# Patient Record
Sex: Female | Born: 1940 | Race: White | Hispanic: No | Marital: Single | Smoking: Never smoker
Health system: Southern US, Community
[De-identification: ages and names within clinical notes are randomized; demographics above are authoritative.]

## PROBLEM LIST (undated history)

## (undated) DIAGNOSIS — M109 Gout, unspecified: Secondary | ICD-10-CM

## (undated) DIAGNOSIS — I10 Essential (primary) hypertension: Secondary | ICD-10-CM

## (undated) DIAGNOSIS — N289 Disorder of kidney and ureter, unspecified: Secondary | ICD-10-CM

## (undated) DIAGNOSIS — E039 Hypothyroidism, unspecified: Secondary | ICD-10-CM

## (undated) DIAGNOSIS — E785 Hyperlipidemia, unspecified: Secondary | ICD-10-CM

## (undated) DIAGNOSIS — I6529 Occlusion and stenosis of unspecified carotid artery: Secondary | ICD-10-CM

## (undated) DIAGNOSIS — R7301 Impaired fasting glucose: Secondary | ICD-10-CM

## (undated) DIAGNOSIS — K219 Gastro-esophageal reflux disease without esophagitis: Secondary | ICD-10-CM

## (undated) DIAGNOSIS — J45909 Unspecified asthma, uncomplicated: Secondary | ICD-10-CM

---

## 2015-02-25 DIAGNOSIS — N1831 Chronic kidney disease, stage 3a: Secondary | ICD-10-CM | POA: Diagnosis present

## 2015-02-25 DIAGNOSIS — K219 Gastro-esophageal reflux disease without esophagitis: Secondary | ICD-10-CM | POA: Insufficient documentation

## 2015-02-25 DIAGNOSIS — H8109 Meniere's disease, unspecified ear: Secondary | ICD-10-CM | POA: Insufficient documentation

## 2015-02-25 DIAGNOSIS — J4520 Mild intermittent asthma, uncomplicated: Secondary | ICD-10-CM | POA: Insufficient documentation

## 2015-02-25 DIAGNOSIS — E039 Hypothyroidism, unspecified: Secondary | ICD-10-CM | POA: Insufficient documentation

## 2015-02-25 DIAGNOSIS — M109 Gout, unspecified: Secondary | ICD-10-CM | POA: Insufficient documentation

## 2015-02-25 DIAGNOSIS — E785 Hyperlipidemia, unspecified: Secondary | ICD-10-CM | POA: Insufficient documentation

## 2015-05-27 DIAGNOSIS — E559 Vitamin D deficiency, unspecified: Secondary | ICD-10-CM | POA: Insufficient documentation

## 2016-09-17 DIAGNOSIS — M7741 Metatarsalgia, right foot: Secondary | ICD-10-CM | POA: Insufficient documentation

## 2020-10-20 DIAGNOSIS — I771 Stricture of artery: Secondary | ICD-10-CM | POA: Insufficient documentation

## 2021-07-02 ENCOUNTER — Emergency Department (HOSPITAL_COMMUNITY): Payer: Medicare Other

## 2021-07-02 ENCOUNTER — Inpatient Hospital Stay (HOSPITAL_COMMUNITY)
Admission: EM | Admit: 2021-07-02 | Discharge: 2021-07-08 | DRG: 640 | Disposition: A | Discharge status: Skilled Nursing Facility | Payer: Medicare Other | Attending: Internal Medicine | Admitting: Internal Medicine

## 2021-07-02 ENCOUNTER — Observation Stay (HOSPITAL_COMMUNITY): Payer: Medicare Other

## 2021-07-02 ENCOUNTER — Other Ambulatory Visit: Payer: Self-pay

## 2021-07-02 ENCOUNTER — Encounter (HOSPITAL_COMMUNITY): Payer: Self-pay | Admitting: Emergency Medicine

## 2021-07-02 DIAGNOSIS — K219 Gastro-esophageal reflux disease without esophagitis: Secondary | ICD-10-CM | POA: Diagnosis present

## 2021-07-02 DIAGNOSIS — Z880 Allergy status to penicillin: Secondary | ICD-10-CM

## 2021-07-02 DIAGNOSIS — N1831 Chronic kidney disease, stage 3a: Secondary | ICD-10-CM | POA: Diagnosis present

## 2021-07-02 DIAGNOSIS — E876 Hypokalemia: Secondary | ICD-10-CM | POA: Diagnosis present

## 2021-07-02 DIAGNOSIS — R9431 Abnormal electrocardiogram [ECG] [EKG]: Secondary | ICD-10-CM | POA: Diagnosis present

## 2021-07-02 DIAGNOSIS — T424X5A Adverse effect of benzodiazepines, initial encounter: Secondary | ICD-10-CM | POA: Diagnosis present

## 2021-07-02 DIAGNOSIS — H8109 Meniere's disease, unspecified ear: Secondary | ICD-10-CM | POA: Diagnosis present

## 2021-07-02 DIAGNOSIS — R54 Age-related physical debility: Secondary | ICD-10-CM | POA: Diagnosis present

## 2021-07-02 DIAGNOSIS — J479 Bronchiectasis, uncomplicated: Secondary | ICD-10-CM | POA: Diagnosis present

## 2021-07-02 DIAGNOSIS — G9340 Encephalopathy, unspecified: Secondary | ICD-10-CM | POA: Diagnosis not present

## 2021-07-02 DIAGNOSIS — R001 Bradycardia, unspecified: Secondary | ICD-10-CM | POA: Diagnosis not present

## 2021-07-02 DIAGNOSIS — Z881 Allergy status to other antibiotic agents status: Secondary | ICD-10-CM

## 2021-07-02 DIAGNOSIS — I6523 Occlusion and stenosis of bilateral carotid arteries: Secondary | ICD-10-CM | POA: Diagnosis present

## 2021-07-02 DIAGNOSIS — N179 Acute kidney failure, unspecified: Secondary | ICD-10-CM | POA: Diagnosis present

## 2021-07-02 DIAGNOSIS — I129 Hypertensive chronic kidney disease with stage 1 through stage 4 chronic kidney disease, or unspecified chronic kidney disease: Secondary | ICD-10-CM | POA: Diagnosis present

## 2021-07-02 DIAGNOSIS — E871 Hypo-osmolality and hyponatremia: Secondary | ICD-10-CM | POA: Diagnosis present

## 2021-07-02 DIAGNOSIS — E039 Hypothyroidism, unspecified: Secondary | ICD-10-CM | POA: Diagnosis present

## 2021-07-02 DIAGNOSIS — R569 Unspecified convulsions: Secondary | ICD-10-CM | POA: Diagnosis present

## 2021-07-02 DIAGNOSIS — I10 Essential (primary) hypertension: Secondary | ICD-10-CM | POA: Diagnosis present

## 2021-07-02 DIAGNOSIS — T450X5A Adverse effect of antiallergic and antiemetic drugs, initial encounter: Secondary | ICD-10-CM | POA: Diagnosis present

## 2021-07-02 DIAGNOSIS — Z888 Allergy status to other drugs, medicaments and biological substances status: Secondary | ICD-10-CM

## 2021-07-02 DIAGNOSIS — R4701 Aphasia: Secondary | ICD-10-CM | POA: Diagnosis present

## 2021-07-02 DIAGNOSIS — G928 Other toxic encephalopathy: Secondary | ICD-10-CM | POA: Diagnosis present

## 2021-07-02 DIAGNOSIS — H3530 Unspecified macular degeneration: Secondary | ICD-10-CM | POA: Diagnosis present

## 2021-07-02 DIAGNOSIS — E441 Mild protein-calorie malnutrition: Secondary | ICD-10-CM | POA: Diagnosis present

## 2021-07-02 DIAGNOSIS — R4182 Altered mental status, unspecified: Principal | ICD-10-CM

## 2021-07-02 DIAGNOSIS — D631 Anemia in chronic kidney disease: Secondary | ICD-10-CM | POA: Diagnosis present

## 2021-07-02 DIAGNOSIS — J45909 Unspecified asthma, uncomplicated: Secondary | ICD-10-CM | POA: Diagnosis present

## 2021-07-02 DIAGNOSIS — Z7982 Long term (current) use of aspirin: Secondary | ICD-10-CM

## 2021-07-02 DIAGNOSIS — E785 Hyperlipidemia, unspecified: Secondary | ICD-10-CM | POA: Diagnosis present

## 2021-07-02 DIAGNOSIS — Z8701 Personal history of pneumonia (recurrent): Secondary | ICD-10-CM

## 2021-07-02 DIAGNOSIS — M109 Gout, unspecified: Secondary | ICD-10-CM | POA: Diagnosis present

## 2021-07-02 DIAGNOSIS — F05 Delirium due to known physiological condition: Secondary | ICD-10-CM | POA: Diagnosis not present

## 2021-07-02 DIAGNOSIS — Z1152 Encounter for screening for COVID-19: Secondary | ICD-10-CM

## 2021-07-02 DIAGNOSIS — Z79899 Other long term (current) drug therapy: Secondary | ICD-10-CM

## 2021-07-02 DIAGNOSIS — Z6821 Body mass index (BMI) 21.0-21.9, adult: Secondary | ICD-10-CM

## 2021-07-02 HISTORY — DX: Unspecified asthma, uncomplicated: J45.909

## 2021-07-02 HISTORY — DX: Disorder of kidney and ureter, unspecified: N28.9

## 2021-07-02 HISTORY — DX: Gout, unspecified: M10.9

## 2021-07-02 HISTORY — DX: Hyperlipidemia, unspecified: E78.5

## 2021-07-02 HISTORY — DX: Gastro-esophageal reflux disease without esophagitis: K21.9

## 2021-07-02 HISTORY — DX: Hypothyroidism, unspecified: E03.9

## 2021-07-02 HISTORY — DX: Occlusion and stenosis of unspecified carotid artery: I65.29

## 2021-07-02 HISTORY — DX: Essential (primary) hypertension: I10

## 2021-07-02 HISTORY — DX: Impaired fasting glucose: R73.01

## 2021-07-02 LAB — DIFFERENTIAL
Abs Immature Granulocytes: 0.02 10*3/uL (ref 0.00–0.07)
Basophils Absolute: 0 10*3/uL (ref 0.0–0.1)
Basophils Relative: 0 %
Eosinophils Absolute: 0 10*3/uL (ref 0.0–0.5)
Eosinophils Relative: 0 %
Immature Granulocytes: 0 %
Lymphocytes Relative: 25 %
Lymphs Abs: 1.9 10*3/uL (ref 0.7–4.0)
Monocytes Absolute: 0.4 10*3/uL (ref 0.1–1.0)
Monocytes Relative: 5 %
Neutro Abs: 5.4 10*3/uL (ref 1.7–7.7)
Neutrophils Relative %: 70 %

## 2021-07-02 LAB — CBC
HCT: 31.2 % — ABNORMAL LOW (ref 36.0–46.0)
Hemoglobin: 10.5 g/dL — ABNORMAL LOW (ref 12.0–15.0)
MCH: 29.1 pg (ref 26.0–34.0)
MCHC: 33.7 g/dL (ref 30.0–36.0)
MCV: 86.4 fL (ref 80.0–100.0)
Platelets: 166 10*3/uL (ref 150–400)
RBC: 3.61 MIL/uL — ABNORMAL LOW (ref 3.87–5.11)
RDW: 14.2 % (ref 11.5–15.5)
WBC: 7.7 10*3/uL (ref 4.0–10.5)
nRBC: 0 % (ref 0.0–0.2)

## 2021-07-02 LAB — URINALYSIS, ROUTINE W REFLEX MICROSCOPIC
Bilirubin Urine: NEGATIVE
Glucose, UA: NEGATIVE mg/dL
Hgb urine dipstick: NEGATIVE
Ketones, ur: NEGATIVE mg/dL
Leukocytes,Ua: NEGATIVE
Nitrite: NEGATIVE
Protein, ur: 30 mg/dL — AB
Specific Gravity, Urine: 1.01 (ref 1.005–1.030)
pH: 7.5 (ref 5.0–8.0)

## 2021-07-02 LAB — COMPREHENSIVE METABOLIC PANEL
ALT: 13 U/L (ref 0–44)
AST: 15 U/L (ref 15–41)
Albumin: 3.8 g/dL (ref 3.5–5.0)
Alkaline Phosphatase: 82 U/L (ref 38–126)
Anion gap: 15 (ref 5–15)
BUN: 11 mg/dL (ref 8–23)
CO2: 29 mmol/L (ref 22–32)
Calcium: 7.6 mg/dL — ABNORMAL LOW (ref 8.9–10.3)
Chloride: 92 mmol/L — ABNORMAL LOW (ref 98–111)
Creatinine, Ser: 0.86 mg/dL (ref 0.44–1.00)
GFR, Estimated: >60 mL/min (ref >60)
Glucose, Bld: 121 mg/dL — ABNORMAL HIGH (ref 70–99)
Potassium: 2.6 mmol/L — CL (ref 3.5–5.1)
Sodium: 136 mmol/L (ref 135–145)
Total Bilirubin: 1.2 mg/dL (ref 0.3–1.2)
Total Protein: 6.7 g/dL (ref 6.5–8.1)

## 2021-07-02 LAB — BASIC METABOLIC PANEL
Anion gap: 13 (ref 5–15)
BUN: 10 mg/dL (ref 8–23)
CO2: 27 mmol/L (ref 22–32)
Calcium: 7.6 mg/dL — ABNORMAL LOW (ref 8.9–10.3)
Chloride: 94 mmol/L — ABNORMAL LOW (ref 98–111)
Creatinine, Ser: 0.86 mg/dL (ref 0.44–1.00)
GFR, Estimated: >60 mL/min (ref >60)
Glucose, Bld: 101 mg/dL — ABNORMAL HIGH (ref 70–99)
Potassium: 4.6 mmol/L (ref 3.5–5.1)
Sodium: 134 mmol/L — ABNORMAL LOW (ref 135–145)

## 2021-07-02 LAB — URINALYSIS, MICROSCOPIC (REFLEX): Bacteria, UA: NONE SEEN

## 2021-07-02 LAB — RAPID URINE DRUG SCREEN, HOSP PERFORMED
Amphetamines: NOT DETECTED
Barbiturates: NOT DETECTED
Benzodiazepines: POSITIVE — AB
Cocaine: NOT DETECTED
Opiates: NOT DETECTED
Tetrahydrocannabinol: NOT DETECTED

## 2021-07-02 LAB — I-STAT CHEM 8, ED
BUN: 11 mg/dL (ref 8–23)
Calcium, Ion: 0.8 mmol/L — CL (ref 1.15–1.40)
Chloride: 87 mmol/L — ABNORMAL LOW (ref 98–111)
Creatinine, Ser: 0.8 mg/dL (ref 0.44–1.00)
Glucose, Bld: 120 mg/dL — ABNORMAL HIGH (ref 70–99)
HCT: 31 % — ABNORMAL LOW (ref 36.0–46.0)
Hemoglobin: 10.5 g/dL — ABNORMAL LOW (ref 12.0–15.0)
Potassium: 2.5 mmol/L — CL (ref 3.5–5.1)
Sodium: 134 mmol/L — ABNORMAL LOW (ref 135–145)
TCO2: 32 mmol/L (ref 22–32)

## 2021-07-02 LAB — RESP PANEL BY RT-PCR (FLU A&B, COVID) ARPGX2
Influenza A by PCR: NEGATIVE
Influenza B by PCR: NEGATIVE
SARS Coronavirus 2 by RT PCR: NEGATIVE

## 2021-07-02 LAB — HEMOGLOBIN A1C
Hgb A1c MFr Bld: 4.5 % — ABNORMAL LOW (ref 4.8–5.6)
Mean Plasma Glucose: 82.45 mg/dL

## 2021-07-02 LAB — CBG MONITORING, ED: Glucose-Capillary: 127 mg/dL — ABNORMAL HIGH (ref 70–99)

## 2021-07-02 LAB — LACTIC ACID, PLASMA: Lactic Acid, Venous: 1.3 mmol/L (ref 0.5–1.9)

## 2021-07-02 LAB — APTT: aPTT: 29 seconds (ref 24–36)

## 2021-07-02 LAB — PROTIME-INR
INR: 1.2 (ref 0.8–1.2)
Prothrombin Time: 14.8 seconds (ref 11.4–15.2)

## 2021-07-02 LAB — MAGNESIUM: Magnesium: 0.8 mg/dL — CL (ref 1.7–2.4)

## 2021-07-02 LAB — ETHANOL: Alcohol, Ethyl (B): <10 mg/dL (ref <10)

## 2021-07-02 MED ORDER — LEVETIRACETAM 500 MG PO TABS
500.0000 mg | ORAL_TABLET | Freq: Two times a day (BID) | ORAL | Status: DC
  Administered 2021-07-03 – 2021-07-08 (×10): 500 mg via ORAL
  Filled 2021-07-02 (×13): qty 1

## 2021-07-02 MED ORDER — POLYETHYL GLYCOL-PROPYL GLYCOL 0.4-0.3 % OP SOLN
1.0000 [drp] | Freq: Every day | OPHTHALMIC | Status: DC
Start: 2021-07-02 — End: 2021-07-02

## 2021-07-02 MED ORDER — LOSARTAN POTASSIUM 50 MG PO TABS
100.0000 mg | ORAL_TABLET | Freq: Every day | ORAL | Status: DC
  Administered 2021-07-02 – 2021-07-08 (×7): 100 mg via ORAL
  Filled 2021-07-02 (×7): qty 2

## 2021-07-02 MED ORDER — HYDRALAZINE HCL 20 MG/ML IJ SOLN
10.0000 mg | Freq: Once | INTRAMUSCULAR | Status: AC
  Administered 2021-07-02: 10 mg via INTRAVENOUS
  Filled 2021-07-02: qty 1

## 2021-07-02 MED ORDER — IPRATROPIUM-ALBUTEROL 20-100 MCG/ACT IN AERS: 1.0000 | INHALATION_SPRAY | Freq: Four times a day (QID) | RESPIRATORY_TRACT | Status: DC | PRN

## 2021-07-02 MED ORDER — MONTELUKAST SODIUM 10 MG PO TABS
10.0000 mg | ORAL_TABLET | Freq: Every day | ORAL | Status: DC
  Administered 2021-07-03 – 2021-07-08 (×6): 10 mg via ORAL
  Filled 2021-07-02 (×6): qty 1

## 2021-07-02 MED ORDER — ASPIRIN 81 MG PO TBEC
81.0000 mg | DELAYED_RELEASE_TABLET | Freq: Every day | ORAL | Status: DC
  Administered 2021-07-03 – 2021-07-08 (×6): 81 mg via ORAL
  Filled 2021-07-02 (×6): qty 1

## 2021-07-02 MED ORDER — CALCIUM GLUCONATE-NACL 1-0.675 GM/50ML-% IV SOLN
1.0000 g | Freq: Once | INTRAVENOUS | Status: AC
  Administered 2021-07-02: 1000 mg via INTRAVENOUS
  Filled 2021-07-02: qty 50

## 2021-07-02 MED ORDER — PANTOPRAZOLE SODIUM 40 MG PO TBEC
40.0000 mg | DELAYED_RELEASE_TABLET | Freq: Every day | ORAL | Status: DC
  Administered 2021-07-03 – 2021-07-08 (×6): 40 mg via ORAL
  Filled 2021-07-02 (×6): qty 1

## 2021-07-02 MED ORDER — ACETAMINOPHEN 325 MG PO TABS
650.0000 mg | ORAL_TABLET | Freq: Four times a day (QID) | ORAL | Status: DC | PRN
  Administered 2021-07-02 – 2021-07-06 (×3): 650 mg via ORAL
  Filled 2021-07-02 (×4): qty 2

## 2021-07-02 MED ORDER — LEVETIRACETAM IN NACL 1000 MG/100ML IV SOLN
1000.0000 mg | INTRAVENOUS | Status: AC
  Administered 2021-07-02: 1000 mg via INTRAVENOUS
  Filled 2021-07-02: qty 100

## 2021-07-02 MED ORDER — IOHEXOL 350 MG/ML SOLN
100.0000 mL | Freq: Once | INTRAVENOUS | Status: AC | PRN
  Administered 2021-07-02: 100 mL via INTRAVENOUS

## 2021-07-02 MED ORDER — POTASSIUM CHLORIDE 10 MEQ/100ML IV SOLN
10.0000 meq | INTRAVENOUS | Status: AC
  Administered 2021-07-02 (×4): 10 meq via INTRAVENOUS
  Filled 2021-07-02 (×4): qty 100

## 2021-07-02 MED ORDER — GUAIFENESIN-DM 100-10 MG/5ML PO SYRP
5.0000 mL | ORAL_SOLUTION | ORAL | Status: DC | PRN
Start: 2021-07-02 — End: 2021-07-08
  Administered 2021-07-02 – 2021-07-05 (×4): 5 mL via ORAL
  Filled 2021-07-02 (×5): qty 5

## 2021-07-02 MED ORDER — LABETALOL HCL 5 MG/ML IV SOLN
10.0000 mg | INTRAVENOUS | Status: DC | PRN
Start: 2021-07-02 — End: 2021-07-04
  Administered 2021-07-03: 10 mg via INTRAVENOUS
  Filled 2021-07-02 (×2): qty 4

## 2021-07-02 MED ORDER — LEVOTHYROXINE SODIUM 100 MCG PO TABS
100.0000 ug | ORAL_TABLET | Freq: Every morning | ORAL | Status: DC
  Administered 2021-07-03 – 2021-07-07 (×4): 100 ug via ORAL
  Filled 2021-07-02 (×7): qty 1

## 2021-07-02 MED ORDER — CHOLECALCIFEROL 50 MCG (2000 UT) PO CAPS: 1.0000 | ORAL_CAPSULE | Freq: Every day | ORAL | Status: DC

## 2021-07-02 MED ORDER — VITAMIN D 25 MCG (1000 UNIT) PO TABS
2000.0000 [IU] | ORAL_TABLET | Freq: Every day | ORAL | Status: DC
  Administered 2021-07-03 – 2021-07-08 (×6): 2000 [IU] via ORAL
  Filled 2021-07-02 (×6): qty 2

## 2021-07-02 MED ORDER — HYDROCHLOROTHIAZIDE 12.5 MG PO TABS
12.5000 mg | ORAL_TABLET | ORAL | Status: DC
  Filled 2021-07-02: qty 1

## 2021-07-02 MED ORDER — IPRATROPIUM-ALBUTEROL 0.5-2.5 (3) MG/3ML IN SOLN: 3.0000 mL | Freq: Four times a day (QID) | RESPIRATORY_TRACT | Status: DC | PRN

## 2021-07-02 MED ORDER — METOPROLOL SUCCINATE ER 100 MG PO TB24
100.0000 mg | ORAL_TABLET | Freq: Every day | ORAL | Status: DC
  Administered 2021-07-02 – 2021-07-04 (×3): 100 mg via ORAL
  Filled 2021-07-02: qty 1
  Filled 2021-07-02: qty 4
  Filled 2021-07-02: qty 1

## 2021-07-02 MED ORDER — COENZYME Q10 200 MG PO CAPS
200.0000 mg | ORAL_CAPSULE | Freq: Every day | ORAL | Status: DC
Start: 2021-07-02 — End: 2021-07-02

## 2021-07-02 MED ORDER — ALBUTEROL SULFATE (2.5 MG/3ML) 0.083% IN NEBU: 3.0000 mL | INHALATION_SOLUTION | Freq: Four times a day (QID) | RESPIRATORY_TRACT | Status: DC | PRN

## 2021-07-02 MED ORDER — MAGNESIUM SULFATE 2 GM/50ML IV SOLN
2.0000 g | Freq: Once | INTRAVENOUS | Status: AC
  Administered 2021-07-02: 2 g via INTRAVENOUS
  Filled 2021-07-02: qty 50

## 2021-07-02 MED ORDER — ENOXAPARIN SODIUM 40 MG/0.4ML IJ SOSY
40.0000 mg | PREFILLED_SYRINGE | INTRAMUSCULAR | Status: DC
  Administered 2021-07-02 – 2021-07-07 (×6): 40 mg via SUBCUTANEOUS
  Filled 2021-07-02 (×6): qty 0.4

## 2021-07-02 MED ORDER — MELATONIN 3 MG PO TABS
3.0000 mg | ORAL_TABLET | Freq: Every day | ORAL | Status: DC
  Administered 2021-07-02 – 2021-07-07 (×5): 3 mg via ORAL
  Filled 2021-07-02 (×6): qty 1

## 2021-07-02 NOTE — Consult Note (Signed)
Neurology Consultation    Reason for Consult: Code stroke for aphasia  CC: Aphasia  HISTORY OF PRESENT ILLNESS   HPI 81 year old past history of hypertension, hyperlipidemia, allergies, recent respiratory infection/pneumonia for which she was on antibiotics and prednisone that she finished last Friday, macular degeneration, CKD stage III, impaired fasting glucose, presented to the emergency room for evaluation of strokelike symptoms by [facility]. Patient was last known well at 12 PM lunchtime 07/01/2021 when she started having some confusion and difficulty with her words.  Family thought that she had been recovering from a pneumonia and was just feeling sick.  The symptoms did not improve and actually the speech got worse prompting them to call EMS this morning.  When EMS arrived, they found her to have aphasia for which code stroke was activated as she was still within the 24-hour window.  She is unable to provide any meaningful history History is obtained over the phone from sister.   Premorbid modified Rankin scale (mRS): 0-according to the sister  ROS: Unable to obtain due to altered mental status.   PAST MEDICAL HISTORY    Past Medical History:  No past medical history on file. As documented in the HPI  No family history on file. No family history on file.  Allergies:  Not on File Seasonal allergies  Social History:   has no history on file for tobacco use, alcohol use, and drug use.    Medications Copied from care everywhere note of February 19, 2021 from family medicine outpatient visit Current Outpatient Medications:   albuterol 90 mcg/actuation inhaler, Inhale 1-2 puffs into the lungs every 6 (six) hours as needed for Wheezing., Disp: 1 each, Rfl: 0  ANTIOX#10/OM3/DHA/EPA/LUT/ZEAX (I-CAPS ORAL), Take by mouth. 2 tabs daily, Disp: , Rfl:   aspirin 81 MG EC tablet *ANTIPLATELET*, Take 1 tablet (81 mg total) by mouth daily., Disp: , Rfl:   atorvastatin (LIPITOR) 40 MG  tablet, TAKE 1 TABLET BY MOUTH EVERY DAY FOR CHOLESTEROL, Disp: 90 tablet, Rfl: 3  cholecalciferol, vitamin D3, 50 mcg (2,000 unit) Cap, Take 1 capsule by mouth daily., Disp: , Rfl:   coenzyme Q10 200 mg capsule, Take 200 mg by mouth daily., Disp: , Rfl:   diazePAM (VALIUM) 5 MG tablet, Take 1 tablet (5 mg total) by mouth 4 (four) times daily as needed for Anxiety., Disp: 36 tablet, Rfl: 2  fexofenadine (ALLEGRA) 180 MG tablet, Take 1 tablet (180 mg total) by mouth daily., Disp: , Rfl:   hydroCHLOROthiazide (MICROZIDE) 12.5 mg capsule, TAKE 1 CAPSULE BY MOUTH EVERY OTHER DAY FOR SWELLING, Disp: 90 capsule, Rfl: 3  ipratropium-albuterol (COMBIVENT RESPIMAT) 20-100 mcg/actuation inhaler, Inhale 1 puff into the lungs every 6 (six) hours as needed., Disp: 4 g, Rfl: 3  levothyroxine (SYNTHROID) 100 MCG tablet, TAKE 1 TABLET BY MOUTH EVERY MORNING ON AN EMPTY STOMACH, Disp: 90 tablet, Rfl: 3  losartan (COZAAR) 100 MG tablet, TAKE 1 TABLET BY MOUTH EVERY DAY, Disp: 90 tablet, Rfl: 3  meclizine (ANTIVERT) 25 mg tablet, TAKE 1 TABLET BY MOUTH 3 TIMES DAILY AS NEEDED FOR DIZZINESS, Disp: 36 tablet, Rfl: 5  metoPROLOL succinate (TOPROL-XL) 100 MG 24 hr tablet, TAKE 1 TABLET BY MOUTH EVERY DAY FOR BLOOD PRESSURE, Disp: 90 tablet, Rfl: 3  montelukast (SINGULAIR) 10 mg tablet, TAKE 1 TABLET BY MOUTH EVERY DAY AS DIRECTED, Disp: 30 tablet, Rfl: 11  omeprazole (PRILOSEC) 40 MG capsule, TAKE 1 CAPSULE BY MOUTH 2 TIMES DAILY, Disp: 180 capsule, Rfl: 3  PEG  400-propylene glycol (SYSTANE) 0.4-0.3 % Drop ophthalmic solution, Apply 1 drop to eye 2 times daily., Disp: , Rfl:    EXAMINATION    Current vital signs:    07/02/2021    8:30 AM  Vitals with BMI  Pulse 90    Examination:  General: Awake alert in no distress HEENT: Normocephalic atraumatic Lungs: Clear CVS: Regular rate rhythm Abdomen nondistended nontender Neurological exam Awake alert Able to tell her name Able to tell her age correctly Could  not tell the month correctly Has significant difficulty with naming, fluency and repetition.  Has word salad type of speech.  Was able to repeat single words but has a word salad upon trying to repeat longer sentences. Follows simple commands somewhat more consistently than complex commands which she is not able to follow. Cranial nerves: Pupils equal round react light, extraocular movements intact, visual fields full, face appears symmetric, tongue and palate midline. 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 NIHSS 1a Level of Conscious.: 0 1b LOC Questions: 1 1c LOC Commands: 0 2 Best Gaze: 0 3 Visual: 0 4 Facial Palsy: 0 5a Motor Arm - left: 0 5b Motor Arm - Right: 0 6a Motor Leg - Left: 0 6b Motor Leg - Right: 0 7 Limb Ataxia: 0 8 Sensory: 0 9 Best Language: 2 10 Dysarthria: 1 11 Extinct. and Inatten.: 0 TOTAL: 4   LABS   I have reviewed labs in epic and the results pertinent to this consultation are:  Lab Results  Component Value Date   WBC 7.7 07/02/2021   HGB 10.5 (L) 07/02/2021   HCT 31.0 (L) 07/02/2021   MCV 86.4 07/02/2021   PLT 166 07/02/2021   Lab Results  Component Value Date   NA 134 (L) 07/02/2021   K 2.5 (LL) 07/02/2021   CL 87 (L) 07/02/2021     DIAGNOSTIC IMAGING/PROCEDURES   I have reviewed the images obtained:, as below   CT-head-aspects 10  CTA head and neck-no emergent LVO, bilateral carotid stenosis 50% at bifurcation.  CT perfusion-no perfusion deficit  MRI brain-recommended and ordered  ASSESSMENT/PLAN    Assessment:  81 year old with above past medical history presenting for evaluation of confusion and aphasia with last known well at noon time yesterday.  Has been sick with a pneumonia and has been treated with  antibiotics and steroids-course of prednisone that she completed on Friday. On examination, she does have mixed receptive and expressive aphasia with worse expressive aphasia. I suspect that she either has an acute stroke or is encephalopathic from her underlying pneumonia or another underlying systemic process.  Impression: Evaluate for stroke Evaluate for toxic metabolic encephalopathy  Recommendations: MRI brain without contrast Stroke work-up only if it is positive for stroke Chest x-ray Urinalysis Also check ammonia level Further recommendations based on imaging  -- Lennie Hummer, PA-C Neurology Department      Attending Neurohospitalist Addendum Patient seen and examined with APP/Resident. Agree with the history and physical as documented above, which have documented. Agree with the plan as documented, which I helped formulate. I have independently reviewed the chart, obtained history, review of systems and examined the patient.I have personally reviewed pertinent head/neck/spine imaging (CT/MRI). Please feel free to call with any questions.  -- Amie Portland, MD Neurologist [facility] Pager: [phone]

## 2021-07-02 NOTE — Code Documentation (Signed)
Pt is an 81 yr old female with recent history of pneumonia. She is on no thinners. Pt was last known well 5/31 12:00 when she became confused and started having trouble with speaking. She awoke even worse this morning and EMS was dispatched. Code stroke activated at 07:45 by EMS for aphasia.     Pt arrived [facility] at 0803. She is alert , no weakness, with aphasia. Airway cleared by EDP, pt to CT scan at 08:10. CT head negative for acute hemorrhage per Dr. Wilford Corner. NIHSS 3 for mod-severe aphasia and wrong LOC question-year. CTA performed, which was negative for LVO per Dr. Wilford Corner.     Pt taken back to room 35 where her workup will continue. Pt will need q 2 hr VS and NIHSS. Bedside handoff with [place] RN complete. Pt not candidate for thrombolysis as OOW. Pt not candidate for thrombectomy as LVO negative.

## 2021-07-02 NOTE — Progress Notes (Signed)
Pt has attempted to get out of bed x 3 times. When asked where she is going she says she doesn't know. Pt denies hunger thirst. Pt has purewick in place for urine out put. Pt given prn tylenol due to pt moaning and groaning. Pt fed cup of applesauce.

## 2021-07-02 NOTE — ED Notes (Addendum)
Pt was soiled. RN cleaned pt, peri care, new sheet, diaper, purwick and chuck applied.

## 2021-07-02 NOTE — Progress Notes (Signed)
EEG complete - results pending 

## 2021-07-02 NOTE — Procedures (Signed)
Patient Name: Crystal Rogers  MRN: [id number]  Epilepsy Attending: Charlsie Quest  Referring Physician/Provider: Milon Dikes, MD Date: 07/02/2021 Duration: 25.20 mins  Patient history: 80yo with transient speech disturbance. EEG to evaluate for seizure  Level of alertness: Awake  AEDs during EEG study: None  Technical aspects: This EEG study was done with scalp electrodes positioned according to the 10-20 International system of electrode placement. Electrical activity was acquired at a sampling rate of 500Hz  and reviewed with a high frequency filter of 70Hz  and a low frequency filter of 1Hz . EEG data were recorded continuously and digitally stored.   Description: The posterior dominant rhythm consists of 8 Hz activity of moderate voltage (25-35 uV) seen predominantly in posterior head regions, symmetric and reactive to eye opening and eye closing. EEG showed near continuous 3 to 5 Hz theta-delta slowing in left temporal region. Hyperventilation and photic stimulation were not performed.     ABNORMALITY - Continuous slow, left temporal region  IMPRESSION: This study is suggestive of cortical dysfunction arising from left temporal region likely secondary to underlying structural abnormality, post-ictal state. No seizures or epileptiform discharges were seen throughout the recording.  Danil Wedge 

## 2021-07-02 NOTE — Hospital Course (Addendum)
HCTZ Hyperaldosteronism GI losses Malnutrition  AM Interview 07/06/21: She is unoriented to place. She is not sur ehow she feels this morning. She would like to go home. She states that she lives with her sister at home. She is OK with [place] giving her sister a call. She states she was able to cook and walk on her own at home. Discussed her treatment plan.   6/5 Plan  Patient with acute encephalopathy likely secondary to electrolyte abnormalities and hospital delirium. MRI negative for stroke. EEG negative for active seizure. On admission patient with hypokalemia to 2.7, low ionized calcium to 0.8, low magnesium to 1.4. Unremarkable UA, normal lactic acid, afebrile, no leukocytosis to suggest infection. Patient is on diazepam and meclizine at home that could be contributing as well. Neurology has evaluated the patient and performed overnight CTM EEG. They recommend Keppra 500 mg BID and outpatient neurology follow-up. PT/OT stating that the patient is not safe to go home at this time. They recommend short term SNF prior to going home. Magnesium is low again today at 1.3, plan to replete. Now suspect that hospital delirium is contributing to disorientation as well. Patient on delirium precautions at this time. This morning, patient states that she wants to go home and is not agreeable to PT's recommendation of SNF. Attempted to obtain more information from the sister regarding the possibility and safety of the patient returning home to live with her sister, but the sister did not answer. Will call again later today.

## 2021-07-02 NOTE — ED Notes (Signed)
Family at bedside. 

## 2021-07-02 NOTE — Progress Notes (Signed)
EEG with left sided slowing. No sz Will keep on LTM EEG overnight Will load with one dose of Keppra  -- Amie Portland, MD Neurologist [facility] Pager: [phone]

## 2021-07-02 NOTE — ED Notes (Signed)
Pt's IV potassium paused for MRI.

## 2021-07-02 NOTE — ED Triage Notes (Signed)
Pt BIB EMS due to a code stroke from home. Pt was LSN 5/31 at noon. Pt was aphasic. Pt is axox4. HTN.

## 2021-07-02 NOTE — Progress Notes (Signed)
LTM EEG hooked up and running - no initial skin breakdown - push button tested - neuro notified. [facility].  

## 2021-07-02 NOTE — Progress Notes (Signed)
Pt has trying to get out of bed x 2. Pt redirected. On call provider informed. New orders in place.

## 2021-07-02 NOTE — H&P (Cosign Needed Addendum)
Date: 07/02/2021               Patient Name:  Crystal Rogers MRN: [id number]  DOB: [birth year] Age / Sex: 81 y.o., female   PCP: Pcp, No         Medical Service: Internal Medicine Teaching Service         Attending Physician: Dr. Evette Doffing, Mallie Mussel, *    First Contact: Dr. France Ravens Pager: [phone]  Second Contact: Dr. Gaylan Gerold  Pager: [phone]       After Hours (After 5p/  First Contact Pager: [phone]  weekends / holidays): Second Contact Pager: [phone]   Chief Complaint: Altered mental status  History of Present Illness: [name] is an 81 year old with a medical history of HTN, HLD, hypothyroidism, gout, GERD, carotid artery stenosis and asthma presenting with altered mental status. History was obtained from the EDP, patient and patient's niece.   Patient was in her usual state of health until yesterday afternoon around 4-5 pm when her sister noticed she was having difficulty with finding her words and confused. She had driven her sister to a doctor's appointment earlier that day. Her sister also noticed she was complaining of a headache. Throughout the night the patient reportedly remained agitated and with worsening confusion so her family called EMS. On arrival, patient had aphasia and code stroke was called.   Patient's niece reports she has never been hospitalized for confusion; however, she has been confused after taking some medications in the past, but she could not recall which ones. She endorses recently taking prednisone for pneumonia; her last dose was Saturday. She cannot recall if she took any antibiotics or other new medications. At baseline [name] is able to complete all of her ADLs independently. She denies any headaches, dizziness, lightheadedness, changes in her vision, chest pain, palpitations, abdominal pain, nausea, vomiting, dysuria, difficulty with bowel movements or weakness. States she was feeling poorly earlier in the week but unable to pinpoint  what was bothering her. She states she sometimes feels lightheaded at home. She states her appetite has been okay. She manages her own medications; states she does not take valium often but unable to quantify this.   Meds:  Albuterol 90 mcg 1-2 puffs q6h prn wheezing  Aspirin 81 mg daily  Atorvastatin 40 mg daily  Azelastine 137 mcg nasal spray BID  Cholecalciferol 50 mcg daily  Clarithromycin 500 mg BIG (end date 5/31) Coenzyeme Q10 200 mg daily  Colchicine 0.6 mg for gout flares Diazepam 5 mg q8h prn  Fexofenadine 180 mg daily  HCTZ 12.5 mg every other day  Ipratropium-albuterol 20-100 mcg 1 puff q6h prn Levothyroxine 100 mcg daily  Losartan 100 mg daily  Meclizine 25 mg TID prn Metoprolol succinate 100 mg daily  Montelukast 10 mg dil  Omeprazole 40 mg BID  PEG 400-propylene glycol 1 drop each eye BID   Allergies: Allergies as of 07/02/2021 - Review Complete 07/02/2021  Allergen Reaction Noted   Allopurinol Hives 07/02/2021   Augmentin [amoxicillin-pot clavulanate] Nausea And Vomiting 07/02/2021   Doxycycline Hives 07/02/2021   Levaquin [levofloxacin] Other (See Comments) 07/02/2021   Zithromax [azithromycin] Diarrhea 07/02/2021   Past Medical History:  Diagnosis Date   Asthma    Carotid artery stenosis    GERD (gastroesophageal reflux disease)    Gout    Hyperlipidemia    Hypertension    Hypothyroid    IFG (impaired fasting glucose)    Renal disorder  Family History: No pertinent family history   Social History: Lives at home with her sister. Able to completed all of her ADLs and iADLs. Still drives. Denies any history of tobacco, drug or alcohol use.   Review of Systems: A complete ROS was negative except as per HPI.   Physical Exam: Blood pressure (!) 173/77, pulse (!) 113, temperature 99.2 F (37.3 C), temperature source Oral, resp. rate 18, SpO2 99 %. Physical Exam General: alert, appears stated age, in no acute distress HEENT: Normocephalic,  atraumatic, EOM intact, conjunctiva normal CV: Tachycardic, regular rhythm, no murmurs rubs or gallops Pulm: Clear to auscultation bilaterally, normal work of breathing Abdomen: Soft, nondistended, bowel sounds present, no tenderness to palpation MSK: No lower extremity edema Skin: Warm and dry Neuro: Alert and oriented x1 (able to state name but not able to tell me her age/birthday, today's date or that she is in the hospital), answering questions appropriately, recalls events from the past week, difficulty with word finding, no cranial nerve deficit, moving all 4 extremities, strength 4/5 bilaterally, sensation intact   EKG: personally reviewed my interpretation is NSR  CXR: personally reviewed my interpretation is flattened diaphragms, right basilar opacity (improved from priors per read)  CT head and MRI- negative for acute intracranial abnormality  CTA head heck- calcified plaque of proximal internal carotid arteries resulting in 50% stenosis bilaterally   Assessment & Plan by Problem: Principal Problem:   Acute encephalopathy Active Problems:   Expressive aphasia   Uncontrolled hypertension   Hypokalemia   Hypomagnesemia   Bronchiectasis (HCC)  [name] is an 81 yoF with HTN, HLD, hypothyroidism, gout, GERD, carotid artery stenosis, asthma and recent pneumonia presenting with acute encephalopathy and expressive aphasia. Code stroke initiated with negative workup. EEG with concerns for prior seizure.   Acute encephalopathy Expressive aphasia Possible post-ictal state  Patient presented with confusion and expressive aphasia. Code stroke was called, negative CT head and MRI. CTA head and neck showed persistent carotid artery stenosis. EEG with some focal slowing that can indicate prior seizure. No active seizure on spot EEG. Her mentation has improved since being here this morning. Neurological examination with no cranial nerve or focal deficit. She continues to have  difficulty with word finding. Workup has been grossly unremarkable for infection or other acute causes. She is afebrile, with no leukocytosis. UA is unremarkable. Lactic acid normal. UDS was positive for benzodiazepines; diazepam is a home medication for severe vertigo. She was recently treated for a pneumonia and bronchiectasis with prednisone and clarithromycin, this appears improved on chest radiograph. I will repeat a respiratory culture but do not suspect this is contributing. Etiology is unclear but possibly postictal 2/2 seizure vs benzodiazepine use vs TIA vs respiratory infection vs uncontrolled HTN, though less likely. Neurology following and loaded with keppra.  - Neurology following, appreciate recommendations - Loaded with keppra, continue 500 mg BID starting tomorrow - Follow urine, blood and respiratory cultures - Trend CBC and fever curve  - Seizure precautions  - LTM EEG   Recent pneumonia  Bronchiectasis  Asthma Recently seen by outpatient pulmonology for productive cough and abnormal chest radiograph findings of a RLL opacity on 3/30 which persisted on repeat chest radiograph on 4/14 with worsening right apical opacity. CT chest on 5/25 showed progressive RML and RLL bronchiectasis with diffuse opacification of RLL, consistent with mucoid impaction, aspiration or infection. Also noted to have diffuse reticulonodular opacities within right middle and lower lobes. She was  treated with prednisone  20 mg daily for 5 days (last dose reportedly 5/28). She was also on clarithromycin from 4/18 to 5/31. She is continuing to have a productive cough, though chest radiograph shows improving right basilar opacity (unable to see prior imaging). Will continue to monitor.  - Continue combivent  - Maintain oxygen saturations >92% - Pulmonary hygiene     Uncontrolled HTN SBP >180 on admission. She denies any headaches, chest pain but is endorsing some SOB which could be related to asthma and recent  pneumonia. Elevated blood pressures could be contributing to AMS but less likely without signs of end organ damage or pulmonary edema.  -Will resume home medications which include metoprolol succinate, losartan and hctz -Labetalol 10 mg q2h prn SBP>180  Electrolyte Disturbances Low K, mag and calcium on admission, unclear etiology. Will continue to monitor and replete as necessary. -Follow up afternoon BMP  History of Meniere's Disease On diazepam 5 mg q8h and meclizine as needed for severe vertigo. Would recommend discontinuing diazepam as this could be a possible cause of patient's confusion.   HLD Carotid artery stenosis -Continue atorvastatin   Normocytic anemia Hemoglobin 10.5. Will need outpatient workup for possible deficiencies.   Hypothyroidism -Continue levothyroxine   Gout Recently had a gout flare and was on colchicine.    Diet: Regular  VTE ppx: Lovenox  Full Code  Dispo: Admit patient to Observation with expected length of stay less than 2 midnights.  SignedMike Craze, DO 07/02/2021, 3:48 PM  [phone] After 5pm on weekdays and 1pm on weekends: On Call pager: [phone]

## 2021-07-02 NOTE — Progress Notes (Signed)
MRI brain completed and reviewed-no acute stroke. I would recommend adding an EEG to her work-up. We will follow the EEG along with you.   -- Milon Dikes, MD Neurologist [facility] Pager: [phone]

## 2021-07-02 NOTE — ED Provider Notes (Signed)
[facility] Provider Note   CSN: [id number] Arrival date & time: 07/02/21  [id number]  An emergency department physician performed an initial assessment on this suspected stroke patient at [place].  History  Chief Complaint  Patient presents with   Code Stroke    Crystal Rogers is a 81 y.o. female.  81 year old female brought in by EMS from home as a code stroke due to difficulty with speech with last known normal yesterday at noon.  Patient is having a difficult time providing any history, does have a frequent harsh cough.      Home Medications Prior to Admission medications   Medication Sig Start Date End Date Taking? Authorizing Provider  albuterol (VENTOLIN HFA) 108 (90 Base) MCG/ACT inhaler Inhale 1-2 puffs into the lungs every 6 (six) hours as needed for wheezing. 01/18/20  Yes [provider]  aspirin EC 81 MG tablet Take 1 tablet by mouth daily.   Yes [provider]  azelastine (ASTELIN) 0.1 % nasal spray Place 1 spray into both nostrils 2 (two) times daily. 02/19/21  Yes [provider]  Cholecalciferol 50 MCG (2000 UT) CAPS Take 1 capsule by mouth daily.   Yes [provider]  clarithromycin (BIAXIN) 500 MG tablet Take 500 mg by mouth 2 (two) times daily. 05/19/21  Yes [provider]  Coenzyme Q10 200 MG capsule Take 200 mg by mouth daily.   Yes [provider]  colchicine 0.6 MG tablet Take 1 tablet by mouth daily. On day one, take 2 tablets by mouth and after one hour, take 1 additional tablet. On day two and all days after, take one tablet by mouth twice daily. Continue medication twice daily until gout flare has been resolved for 48 hours then discontinue medication. 05/13/21  Yes [provider]  diazepam (VALIUM) 5 MG tablet Take 5 mg by mouth every 8 (eight) hours as needed. 06/22/21  Yes [provider]  fexofenadine (ALLEGRA) 180 MG tablet Take 1 tablet by mouth daily.    Yes [provider]  hydrochlorothiazide (MICROZIDE) 12.5 MG capsule Take 12.5 mg by mouth every other day. 04/07/21  Yes [provider]  Ipratropium-Albuterol (COMBIVENT) 20-100 MCG/ACT AERS respimat Inhale 1 puff into the lungs every 6 (six) hours as needed. 01/26/18  Yes [provider]  levothyroxine (SYNTHROID) 100 MCG tablet Take 100 mcg by mouth every morning. 05/25/21  Yes [provider]  losartan (COZAAR) 100 MG tablet Take 100 mg by mouth daily. 06/03/21  Yes [provider]  meclizine (ANTIVERT) 25 MG tablet Take 25 mg by mouth 3 (three) times daily as needed for dizziness. 05/05/21  Yes [provider]  metoprolol succinate (TOPROL-XL) 100 MG 24 hr tablet Take 100 mg by mouth daily. 04/28/21  Yes [provider]  montelukast (SINGULAIR) 10 MG tablet Take 1 tablet by mouth daily. 10/17/18  Yes [provider]  omeprazole (PRILOSEC) 40 MG capsule Take 40 mg by mouth 2 (two) times daily. 04/14/21  Yes [provider]  Polyethyl Glycol-Propyl Glycol 0.4-0.3 % SOLN Apply 1 drop to eye daily.   Yes [provider]      Allergies    Allopurinol, Augmentin [amoxicillin-pot clavulanate], Doxycycline, Levaquin [levofloxacin], and Zithromax [azithromycin]    Review of Systems   Review of Systems Level 5 caveat for change in speech, difficult historian Physical Exam Updated Vital Signs BP (!) 177/69   Pulse (!) 107   Temp 99.2 F (37.3 C) (Oral)  Resp 16   SpO2 99%  Physical Exam Vitals and nursing note reviewed.  Constitutional:      Appearance: She is ill-appearing. She is not toxic-appearing.  HENT:     Head: Normocephalic and atraumatic.     Nose: Nose normal.     Mouth/Throat:     Mouth: Mucous membranes are moist.  Eyes:     Extraocular Movements: Extraocular movements intact.     Pupils: Pupils are equal, round, and reactive to light.  Cardiovascular:     Rate and Rhythm: Normal rate and  regular rhythm.     Pulses: Normal pulses.     Heart sounds: Normal heart sounds.  Pulmonary:     Effort: Pulmonary effort is normal.     Breath sounds: Examination of the right-middle field reveals rhonchi. Examination of the right-lower field reveals rhonchi. Examination of the left-lower field reveals rhonchi. Rhonchi present.  Abdominal:     Palpations: Abdomen is soft.     Tenderness: There is no abdominal tenderness.  Musculoskeletal:     Right lower leg: No edema.     Left lower leg: No edema.  Skin:    General: Skin is warm and dry.     Findings: No erythema or rash.  Neurological:     Mental Status: She is confused.     Cranial Nerves: No facial asymmetry.     Motor: Motor function is intact.     Comments: Has difficulty following commands, speech garbled at times and clear at times  Psychiatric:        Behavior: Behavior normal.    ED Results / Procedures / Treatments   Labs (all labs ordered are listed, but only abnormal results are displayed) Labs Reviewed  CBC - Abnormal; Notable for the following components:      Result Value   RBC 3.61 (*)    Hemoglobin 10.5 (*)    HCT 31.2 (*)    All other components within normal limits  COMPREHENSIVE METABOLIC PANEL - Abnormal; Notable for the following components:   Potassium 2.6 (*)    Chloride 92 (*)    Glucose, Bld 121 (*)    Calcium 7.6 (*)    All other components within normal limits  RAPID URINE DRUG SCREEN, HOSP PERFORMED - Abnormal; Notable for the following components:   Benzodiazepines POSITIVE (*)    All other components within normal limits  URINALYSIS, ROUTINE W REFLEX MICROSCOPIC - Abnormal; Notable for the following components:   Protein, ur 30 (*)    All other components within normal limits  HEMOGLOBIN A1C - Abnormal; Notable for the following components:   Hgb A1c MFr Bld 4.5 (*)    All other components within normal limits  MAGNESIUM - Abnormal; Notable for the following components:   Magnesium  0.8 (*)    All other components within normal limits  CBG MONITORING, ED - Abnormal; Notable for the following components:   Glucose-Capillary 127 (*)    All other components within normal limits  I-STAT CHEM 8, ED - Abnormal; Notable for the following components:   Sodium 134 (*)    Potassium 2.5 (*)    Chloride 87 (*)    Glucose, Bld 120 (*)    Calcium, Ion 0.80 (*)    Hemoglobin 10.5 (*)    HCT 31.0 (*)    All other components within normal limits  RESP PANEL BY RT-PCR (FLU A&B, COVID) [id number]  CULTURE, BLOOD (ROUTINE X 2)  CULTURE, BLOOD (ROUTINE X  2)  URINE CULTURE  ETHANOL  PROTIME-INR  APTT  DIFFERENTIAL  LACTIC ACID, PLASMA  URINALYSIS, MICROSCOPIC (REFLEX)    EKG EKG Interpretation  Date/Time:  Thursday July 02 2021 08:38:48 EDT Ventricular Rate:  85 PR Interval:  201 QRS Duration: 98 QT Interval:  459 QTC Calculation: 546 R Axis:   23 Text Interpretation: Sinus rhythm Probable left ventricular hypertrophy Prolonged QT interval No old tracing to compare Confirmed by Isla Pence [id number]) on 07/02/2021 8:53:02 AM  Radiology MR BRAIN WO CONTRAST  Result Date: 07/02/2021 CLINICAL DATA:  Neuro deficit, acute, stroke suspected; aphasia EXAM: MRI HEAD WITHOUT CONTRAST TECHNIQUE: Multiplanar, multiecho pulse sequences of the brain and surrounding structures were obtained without intravenous contrast. COMPARISON:  None Available. FINDINGS: Motion artifact is present. Brain: There is no acute infarction or intracranial hemorrhage. There is no intracranial mass, mass effect, or edema. There is no hydrocephalus or extra-axial fluid collection. Prominence of the ventricles and sulci reflects parenchymal volume loss. Patchy and confluent areas of T2 hyperintensity in the supratentorial and pontine white matter are nonspecific but probably reflect moderate chronic microvascular ischemic changes. Vascular: Major vessel flow voids at the skull base are preserved. Skull and upper  cervical spine: Normal marrow signal is preserved. Sinuses/Orbits: Minor mucosal thickening.  Orbits are unremarkable. Other: Sella is unremarkable.  Mastoid air cells are clear. IMPRESSION: No acute infarction, hemorrhage, or mass. Moderate chronic microvascular ischemic changes. Electronically Signed   By: Macy Mis M.D.   On: 07/02/2021 12:12   DG Chest Port 1 View  Result Date: 07/02/2021 CLINICAL DATA:  Questionable sepsis - evaluate for abnormality EXAM: PORTABLE CHEST - 1 VIEW COMPARISON:  05/15/2021 FINDINGS: Cardiomediastinal silhouette and pulmonary vasculature are within normal limits. Biapical pleuroparenchymal scarring again seen. Interval improvement of right basilar opacity. IMPRESSION: Improving right basilar opacity likely related to improving pneumonia/bronchial plugging. Electronically Signed   By: Miachel Roux M.D.   On: 07/02/2021 08:49   CT HEAD CODE STROKE WO CONTRAST  Result Date: 07/02/2021 CLINICAL DATA:  Aphasia EXAM: CT ANGIOGRAPHY HEAD AND NECK CT PERFUSION BRAIN TECHNIQUE: Multidetector CT imaging of the head and neck was performed using the standard protocol during bolus administration of intravenous contrast. Multiplanar CT image reconstructions and MIPs were obtained to evaluate the vascular anatomy. Carotid stenosis measurements (when applicable) are obtained utilizing NASCET criteria, using the distal internal carotid diameter as the denominator. Multiphase CT imaging of the brain was performed following IV bolus contrast injection. Subsequent parametric perfusion maps were calculated using RAPID software. RADIATION DOSE REDUCTION: This exam was performed according to the departmental dose-optimization program which includes automated exposure control, adjustment of the mA and/or kV according to patient size and/or use of iterative reconstruction technique. CONTRAST:  187mL OMNIPAQUE IOHEXOL 350 MG/ML SOLN COMPARISON:  None Available. FINDINGS: CT HEAD FINDINGS Brain:  There is no evidence of acute intracranial hemorrhage, extra-axial fluid collection, or acute infarct. Parenchymal volume is normal for age. The ventricles are normal in size. Gray-white differentiation is preserved. Patchy hypodensity throughout the subcortical and periventricular white matter likely reflects sequela of underlying chronic white matter microangiopathy. There is no solid mass lesion. There is no mass effect or midline shift. Vascular: There is a small extra-axial calcification overlying the right parietal lobe which may reflect calcific emboli of indeterminate age, but there is no occlusion on the subsequently obtained vascular imaging described below. Skull: Normal. Negative for fracture or focal lesion. Sinuses/Orbits: Paranasal sinuses are clear. Bilateral lens implants are in place. The  globes and orbits are otherwise unremarkable. Other: None. ASPECTS ([place] Stroke Program Early CT Score) - Ganglionic level infarction (caudate, lentiform nuclei, internal capsule, insula, M1-M3 cortex): 7 - Supraganglionic infarction (M4-M6 cortex): 3 Total score (0-10 with 10 being normal): 10 CTA NECK FINDINGS Aortic arch: Imaged aortic arch is normal. The origins of the major branch vessels are patent. The subclavian arteries are patent to the level imaged. Right carotid system: The right common carotid artery is patent. There is bulky calcified plaque in the proximal right internal carotid artery resulting in up to approximately 50% stenosis. The distal right internal carotid artery is patent. The right external carotid artery is patent. There is no evidence of dissection or aneurysm. Left carotid system: The left common carotid artery is patent. There is calcified plaque in the proximal left internal carotid artery resulting in up to approximately 50% stenosis. The distal left internal carotid artery is patent. The left external carotid artery is patent. There is no evidence of dissection or aneurysm.  Vertebral arteries: Vertebral arteries are patent, without hemodynamically significant stenosis or occlusion. There is no dissection or aneurysm. Skeleton: There is overall mild multilevel degenerative change throughout the cervical spine. There is no acute osseous abnormality or suspicious osseous lesion. There is no visible canal hematoma. Other neck: The soft tissues of the neck are unremarkable. Upper chest: There is nodular scarring in the lung apices, right more than left, unchanged since the recent chest CT from 06/25/2021. A prominent left upper mediastinal lymph node measuring up to 8 mm is unchanged since the recent CT, nonspecific. Review of the MIP images confirms the above findings CTA HEAD FINDINGS Anterior circulation: There is mild calcified plaque in the intracranial ICAs without hemodynamically significant stenosis. The bilateral MCAs are patent The bilateral ACAs are patent. The anterior communicating artery is normal. There is no aneurysm or AVM. Posterior circulation: The bilateral V4 segments are patent. PICA is identified bilaterally. The basilar artery is patent. The bilateral PCAs are patent. Posterior communicating arteries are not identified. There is no aneurysm or AVM. Venous sinuses: Patent. Anatomic variants: None. Review of the MIP images confirms the above findings CT Brain Perfusion Findings: ASPECTS: 10 CBF (<30%) Volume: 69mL Perfusion (Tmax>6.0s) volume: 21mL Mismatch Volume: 20mL Infarction Location:N/a IMPRESSION: 1. No acute intracranial hemorrhage or infarct.  ASPECTS is 10 2. No infarct core or penumbra identified by CT perfusion. 3. Calcified plaque of the proximal internal carotid arteries resulting in 50% approximately stenosis bilaterally. 4. Patent intracranial vasculature. The results of the initial noncontrast head CT were discussed with Dr. Rory Percy at 8:18 am. The CTA and CTP results were discussed at 8:33am. Electronically Signed   By: Valetta Mole M.D.   On: 07/02/2021  08:38   CT ANGIO HEAD NECK W WO CM W PERF (CODE STROKE)  Result Date: 07/02/2021 CLINICAL DATA:  Aphasia EXAM: CT ANGIOGRAPHY HEAD AND NECK CT PERFUSION BRAIN TECHNIQUE: Multidetector CT imaging of the head and neck was performed using the standard protocol during bolus administration of intravenous contrast. Multiplanar CT image reconstructions and MIPs were obtained to evaluate the vascular anatomy. Carotid stenosis measurements (when applicable) are obtained utilizing NASCET criteria, using the distal internal carotid diameter as the denominator. Multiphase CT imaging of the brain was performed following IV bolus contrast injection. Subsequent parametric perfusion maps were calculated using RAPID software. RADIATION DOSE REDUCTION: This exam was performed according to the departmental dose-optimization program which includes automated exposure control, adjustment of the mA and/or kV according to  patient size and/or use of iterative reconstruction technique. CONTRAST:  127mL OMNIPAQUE IOHEXOL 350 MG/ML SOLN COMPARISON:  None Available. FINDINGS: CT HEAD FINDINGS Brain: There is no evidence of acute intracranial hemorrhage, extra-axial fluid collection, or acute infarct. Parenchymal volume is normal for age. The ventricles are normal in size. Gray-white differentiation is preserved. Patchy hypodensity throughout the subcortical and periventricular white matter likely reflects sequela of underlying chronic white matter microangiopathy. There is no solid mass lesion. There is no mass effect or midline shift. Vascular: There is a small extra-axial calcification overlying the right parietal lobe which may reflect calcific emboli of indeterminate age, but there is no occlusion on the subsequently obtained vascular imaging described below. Skull: Normal. Negative for fracture or focal lesion. Sinuses/Orbits: Paranasal sinuses are clear. Bilateral lens implants are in place. The globes and orbits are otherwise  unremarkable. Other: None. ASPECTS ([place] Stroke Program Early CT Score) - Ganglionic level infarction (caudate, lentiform nuclei, internal capsule, insula, M1-M3 cortex): 7 - Supraganglionic infarction (M4-M6 cortex): 3 Total score (0-10 with 10 being normal): 10 CTA NECK FINDINGS Aortic arch: Imaged aortic arch is normal. The origins of the major branch vessels are patent. The subclavian arteries are patent to the level imaged. Right carotid system: The right common carotid artery is patent. There is bulky calcified plaque in the proximal right internal carotid artery resulting in up to approximately 50% stenosis. The distal right internal carotid artery is patent. The right external carotid artery is patent. There is no evidence of dissection or aneurysm. Left carotid system: The left common carotid artery is patent. There is calcified plaque in the proximal left internal carotid artery resulting in up to approximately 50% stenosis. The distal left internal carotid artery is patent. The left external carotid artery is patent. There is no evidence of dissection or aneurysm. Vertebral arteries: Vertebral arteries are patent, without hemodynamically significant stenosis or occlusion. There is no dissection or aneurysm. Skeleton: There is overall mild multilevel degenerative change throughout the cervical spine. There is no acute osseous abnormality or suspicious osseous lesion. There is no visible canal hematoma. Other neck: The soft tissues of the neck are unremarkable. Upper chest: There is nodular scarring in the lung apices, right more than left, unchanged since the recent chest CT from 06/25/2021. A prominent left upper mediastinal lymph node measuring up to 8 mm is unchanged since the recent CT, nonspecific. Review of the MIP images confirms the above findings CTA HEAD FINDINGS Anterior circulation: There is mild calcified plaque in the intracranial ICAs without hemodynamically significant stenosis. The  bilateral MCAs are patent The bilateral ACAs are patent. The anterior communicating artery is normal. There is no aneurysm or AVM. Posterior circulation: The bilateral V4 segments are patent. PICA is identified bilaterally. The basilar artery is patent. The bilateral PCAs are patent. Posterior communicating arteries are not identified. There is no aneurysm or AVM. Venous sinuses: Patent. Anatomic variants: None. Review of the MIP images confirms the above findings CT Brain Perfusion Findings: ASPECTS: 10 CBF (<30%) Volume: 63mL Perfusion (Tmax>6.0s) volume: 27mL Mismatch Volume: 49mL Infarction Location:N/a IMPRESSION: 1. No acute intracranial hemorrhage or infarct.  ASPECTS is 10 2. No infarct core or penumbra identified by CT perfusion. 3. Calcified plaque of the proximal internal carotid arteries resulting in 50% approximately stenosis bilaterally. 4. Patent intracranial vasculature. The results of the initial noncontrast head CT were discussed with Dr. Rory Percy at 8:18 am. The CTA and CTP results were discussed at 8:33am. Electronically Signed   By:  Valetta Mole M.D.   On: 07/02/2021 08:38    Procedures .Critical Care Performed by: Tacy Learn, PA-C Authorized by: Tacy Learn, PA-C   Critical care provider statement:    Critical care time (minutes):  30   Critical care was time spent personally by me on the following activities:  Development of treatment plan with patient or surrogate, discussions with consultants, evaluation of patient's response to treatment, examination of patient, ordering and review of laboratory studies, ordering and review of radiographic studies, ordering and performing treatments and interventions, pulse oximetry, re-evaluation of patient's condition and review of old charts    Medications Ordered in ED Medications  potassium chloride 10 mEq in 100 mL IVPB (10 mEq Intravenous New Bag/Given 07/02/21 1223)  calcium gluconate 1 g/ 50 mL sodium chloride IVPB (1,000 mg  Intravenous New Bag/Given 07/02/21 1228)  iohexol (OMNIPAQUE) 350 MG/ML injection 100 mL (100 mLs Intravenous Contrast Given 07/02/21 0823)  hydrALAZINE (APRESOLINE) injection 10 mg (10 mg Intravenous Given 07/02/21 0919)  magnesium sulfate IVPB 2 g 50 mL (0 g Intravenous Stopped 07/02/21 1042)    ED Course/ Medical Decision Making/ A&P                           Medical Decision Making Amount and/or Complexity of Data Reviewed Labs: ordered. Radiology: ordered.  Risk Prescription drug management.   This patient presents to the ED for concern of change in speech/aphasia, this involves an extensive number of treatment options, and is a complaint that carries with it a high risk of complications and morbidity.  The differential diagnosis includes but not limited to CVA, sepsis, UA, electrolyte disturbance, TIA   Co morbidities that complicate the patient evaluation  CKD, HTN, hyperlipidemia, carotid artery stenosis    Additional history obtained:  Additional history obtained from EMS who called code stroke for change in speech with last known well noon yesterday External records from outside source obtained and reviewed including records reviewed in care everywhere including visit to pulmonology on Jun 23, 2021 who notes prior chest x-ray from April 30, 2021 showing right lower lobe pneumonia.  Repeat on May 15, 2021 with persistent process now with right apical opacity, treated with prednisone.  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   Lab Tests:  I Ordered, and personally interpreted labs.  The pertinent results include:  UDS positive for benzos (consistent with rx history), covid/flu negative, CBC with normal WBC, hgb 10.5, normal differential. CMP with K 2.6, Ca 7.6; Mg low at 0.8; UA positive for protein, no  evidence of UTI; initial lactic 1.3   Imaging Studies ordered:  I ordered imaging studies including CT head/CTA head and neck  I independently visualized and interpreted imaging which showed no acute process I agree with the radiologist interpretation   Cardiac Monitoring: / EKG:  The patient was maintained on a cardiac monitor.  I personally viewed and interpreted the cardiac monitored which showed an underlying rhythm of: sinus rhythm rate 85   Consultations Obtained:  I requested consultation with the neurologist, Dr. Malen Gauze,   and discussed lab and imaging findings as well as pertinent plan - they recommend: recommends MRI brain without contrast Case discussed with Dr. Gilford Raid, ER attending, recommends add Ca IV, admit, can complete MRI while inpatient.  Discussed with hospitalist who will  consult for admission.    Problem List / ED Course / Critical interventions / Medication management  81 year old female brought in by EMS as code stroke, seen by neuro team on arrival who feels patient does not need neuro intervention at this time, recommends MRI brain without contrast. Patient with change in speech/word salad at times, able to follow commands with some difficulty, moves all extremities. Found to have right lower lobe course lung sounds, see CT chest report as above from 06/26/21. Normal WBC, lactic negative, afebrile, do not suspect bacterial pneumonia based on above and today's CXR. Labs concerning for hypo K/Mg/Ca, replaced via IV. Consulted hospitalist who willo admit.  I have reviewed the patients home medicines and have made adjustments as needed   Social Determinants of Health:  Care provided at outside hospital, records reviewed in [place] / Admission - Considered:  Admit for altered mental status/change in speech, hypo K/Mg/Ca         Final Clinical Impression(s) / ED Diagnoses Final diagnoses:  Altered mental status, unspecified altered  mental status type  Hypomagnesemia  Hypokalemia  Hypocalcemia    Rx / DC Orders ED Discharge Orders     None         Tacy Learn, PA-C 07/02/21 1322    Isla Pence, MD 07/02/21 1545

## 2021-07-02 NOTE — ED Notes (Signed)
Patient transported to MRI 

## 2021-07-02 NOTE — ED Notes (Signed)
PA notified of pts BP.  ?

## 2021-07-03 DIAGNOSIS — I129 Hypertensive chronic kidney disease with stage 1 through stage 4 chronic kidney disease, or unspecified chronic kidney disease: Secondary | ICD-10-CM | POA: Diagnosis present

## 2021-07-03 DIAGNOSIS — E441 Mild protein-calorie malnutrition: Secondary | ICD-10-CM | POA: Diagnosis present

## 2021-07-03 DIAGNOSIS — Z8701 Personal history of pneumonia (recurrent): Secondary | ICD-10-CM | POA: Diagnosis not present

## 2021-07-03 DIAGNOSIS — G9340 Encephalopathy, unspecified: Secondary | ICD-10-CM

## 2021-07-03 DIAGNOSIS — Z880 Allergy status to penicillin: Secondary | ICD-10-CM | POA: Diagnosis not present

## 2021-07-03 DIAGNOSIS — F05 Delirium due to known physiological condition: Secondary | ICD-10-CM | POA: Diagnosis not present

## 2021-07-03 DIAGNOSIS — E039 Hypothyroidism, unspecified: Secondary | ICD-10-CM | POA: Diagnosis present

## 2021-07-03 DIAGNOSIS — E871 Hypo-osmolality and hyponatremia: Secondary | ICD-10-CM | POA: Diagnosis present

## 2021-07-03 DIAGNOSIS — E785 Hyperlipidemia, unspecified: Secondary | ICD-10-CM | POA: Diagnosis present

## 2021-07-03 DIAGNOSIS — R54 Age-related physical debility: Secondary | ICD-10-CM | POA: Diagnosis present

## 2021-07-03 DIAGNOSIS — Z79899 Other long term (current) drug therapy: Secondary | ICD-10-CM | POA: Diagnosis not present

## 2021-07-03 DIAGNOSIS — E876 Hypokalemia: Secondary | ICD-10-CM | POA: Diagnosis present

## 2021-07-03 DIAGNOSIS — I6523 Occlusion and stenosis of bilateral carotid arteries: Secondary | ICD-10-CM | POA: Diagnosis present

## 2021-07-03 DIAGNOSIS — R569 Unspecified convulsions: Secondary | ICD-10-CM | POA: Diagnosis present

## 2021-07-03 DIAGNOSIS — Z888 Allergy status to other drugs, medicaments and biological substances status: Secondary | ICD-10-CM | POA: Diagnosis not present

## 2021-07-03 DIAGNOSIS — D631 Anemia in chronic kidney disease: Secondary | ICD-10-CM | POA: Diagnosis present

## 2021-07-03 DIAGNOSIS — G928 Other toxic encephalopathy: Secondary | ICD-10-CM | POA: Diagnosis present

## 2021-07-03 DIAGNOSIS — R4701 Aphasia: Secondary | ICD-10-CM | POA: Diagnosis present

## 2021-07-03 DIAGNOSIS — N179 Acute kidney failure, unspecified: Secondary | ICD-10-CM | POA: Diagnosis present

## 2021-07-03 DIAGNOSIS — Z881 Allergy status to other antibiotic agents status: Secondary | ICD-10-CM | POA: Diagnosis not present

## 2021-07-03 DIAGNOSIS — Z1152 Encounter for screening for COVID-19: Secondary | ICD-10-CM | POA: Diagnosis not present

## 2021-07-03 DIAGNOSIS — J479 Bronchiectasis, uncomplicated: Secondary | ICD-10-CM | POA: Diagnosis present

## 2021-07-03 DIAGNOSIS — R4182 Altered mental status, unspecified: Secondary | ICD-10-CM | POA: Diagnosis present

## 2021-07-03 DIAGNOSIS — N1831 Chronic kidney disease, stage 3a: Secondary | ICD-10-CM | POA: Diagnosis present

## 2021-07-03 LAB — CBC WITH DIFFERENTIAL/PLATELET
Abs Immature Granulocytes: 0.02 10*3/uL (ref 0.00–0.07)
Basophils Absolute: 0 10*3/uL (ref 0.0–0.1)
Basophils Relative: 0 %
Eosinophils Absolute: 0.1 10*3/uL (ref 0.0–0.5)
Eosinophils Relative: 2 %
HCT: 30.1 % — ABNORMAL LOW (ref 36.0–46.0)
Hemoglobin: 9.8 g/dL — ABNORMAL LOW (ref 12.0–15.0)
Immature Granulocytes: 0 %
Lymphocytes Relative: 26 %
Lymphs Abs: 1.5 10*3/uL (ref 0.7–4.0)
MCH: 28.2 pg (ref 26.0–34.0)
MCHC: 32.6 g/dL (ref 30.0–36.0)
MCV: 86.7 fL (ref 80.0–100.0)
Monocytes Absolute: 0.4 10*3/uL (ref 0.1–1.0)
Monocytes Relative: 7 %
Neutro Abs: 3.7 10*3/uL (ref 1.7–7.7)
Neutrophils Relative %: 65 %
Platelets: 153 10*3/uL (ref 150–400)
RBC: 3.47 MIL/uL — ABNORMAL LOW (ref 3.87–5.11)
RDW: 14.6 % (ref 11.5–15.5)
WBC: 5.7 10*3/uL (ref 4.0–10.5)
nRBC: 0 % (ref 0.0–0.2)

## 2021-07-03 LAB — BASIC METABOLIC PANEL
Anion gap: 12 (ref 5–15)
Anion gap: 12 (ref 5–15)
BUN: 9 mg/dL (ref 8–23)
BUN: 8 mg/dL (ref 8–23)
CO2: 29 mmol/L (ref 22–32)
CO2: 27 mmol/L (ref 22–32)
Calcium: 8.1 mg/dL — ABNORMAL LOW (ref 8.9–10.3)
Calcium: 8.7 mg/dL — ABNORMAL LOW (ref 8.9–10.3)
Chloride: 94 mmol/L — ABNORMAL LOW (ref 98–111)
Chloride: 95 mmol/L — ABNORMAL LOW (ref 98–111)
Creatinine, Ser: 0.87 mg/dL (ref 0.44–1.00)
Creatinine, Ser: 0.73 mg/dL (ref 0.44–1.00)
GFR, Estimated: >60 mL/min (ref >60)
GFR, Estimated: >60 mL/min (ref >60)
Glucose, Bld: 130 mg/dL — ABNORMAL HIGH (ref 70–99)
Glucose, Bld: 83 mg/dL (ref 70–99)
Potassium: 2.7 mmol/L — CL (ref 3.5–5.1)
Potassium: 3.3 mmol/L — ABNORMAL LOW (ref 3.5–5.1)
Sodium: 135 mmol/L (ref 135–145)
Sodium: 134 mmol/L — ABNORMAL LOW (ref 135–145)

## 2021-07-03 LAB — VITAMIN D 25 HYDROXY (VIT D DEFICIENCY, FRACTURES): Vit D, 25-Hydroxy: 52.9 ng/mL (ref 30–100)

## 2021-07-03 LAB — TSH: TSH: 1.184 u[IU]/mL (ref 0.350–4.500)

## 2021-07-03 LAB — CK: Total CK: 66 U/L (ref 38–234)

## 2021-07-03 LAB — MAGNESIUM
Magnesium: 1.4 mg/dL — ABNORMAL LOW (ref 1.7–2.4)
Magnesium: 1.7 mg/dL (ref 1.7–2.4)

## 2021-07-03 MED ORDER — MAGNESIUM SULFATE 2 GM/50ML IV SOLN
2.0000 g | Freq: Once | INTRAVENOUS | Status: AC
  Administered 2021-07-03: 2 g via INTRAVENOUS
  Filled 2021-07-03: qty 50

## 2021-07-03 MED ORDER — AMLODIPINE BESYLATE 10 MG PO TABS
10.0000 mg | ORAL_TABLET | Freq: Every day | ORAL | Status: DC
  Administered 2021-07-03 – 2021-07-07 (×5): 10 mg via ORAL
  Filled 2021-07-03 (×5): qty 1

## 2021-07-03 MED ORDER — POTASSIUM CHLORIDE 20 MEQ PO PACK
40.0000 meq | PACK | Freq: Two times a day (BID) | ORAL | Status: DC
  Administered 2021-07-03 – 2021-07-05 (×4): 40 meq via ORAL
  Filled 2021-07-03 (×5): qty 2

## 2021-07-03 MED ORDER — CALCIUM GLUCONATE-NACL 2-0.675 GM/100ML-% IV SOLN
2.0000 g | Freq: Once | INTRAVENOUS | Status: AC
  Administered 2021-07-03: 2000 mg via INTRAVENOUS
  Filled 2021-07-03: qty 100

## 2021-07-03 MED ORDER — POTASSIUM CHLORIDE 20 MEQ PO PACK
40.0000 meq | PACK | ORAL | Status: AC
  Administered 2021-07-03: 40 meq via ORAL
  Filled 2021-07-03: qty 2

## 2021-07-03 MED ORDER — POTASSIUM CHLORIDE 10 MEQ/100ML IV SOLN
10.0000 meq | INTRAVENOUS | Status: DC
  Administered 2021-07-03 (×4): 10 meq via INTRAVENOUS
  Filled 2021-07-03 (×4): qty 100

## 2021-07-03 NOTE — Progress Notes (Signed)
LTM EEG discontinued - no skin breakdown at unhook.   

## 2021-07-03 NOTE — TOC Initial Note (Signed)
Transition of Care [facility]) - Initial/Assessment Note    Patient Details  Name: Crystal Rogers MRN: [id number] Date of Birth: 09-Jul-1940  Transition of Care [facility]) CM/SW Contact:    Ninfa Meeker, RN Phone Number: 07/03/2021, 12:19 PM  Clinical Narrative:                 Transition of Care Screening Note:  Transition of Care Department [facility]) has reviewed patient and no TOC needs have been identified at this time. We will continue to monitor patient advancement through Interdisciplinary progressions. If new patient transition needs arise, please place a consult.          Patient Goals and CMS Choice        Expected Discharge Plan and Services                                                Prior Living Arrangements/Services                       Activities of Daily Living      Permission Sought/Granted                  Emotional Assessment              Admission diagnosis:  Hypocalcemia [E83.51] Hypokalemia [E87.6] Hypomagnesemia [E83.42] Acute encephalopathy [G93.40] Altered mental status, unspecified altered mental status type [R41.82] Patient Active Problem List   Diagnosis Date Noted   Acute encephalopathy 07/02/2021   Uncontrolled hypertension 07/02/2021   Hypokalemia 07/02/2021   Hypomagnesemia 07/02/2021   Bronchiectasis ([place]) 07/02/2021   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   Metatarsalgia of right foot 09/17/2016   Vitamin D deficiency 05/27/2015   Stage 3a chronic kidney disease ([place]) 02/25/2015   Gastroesophageal reflux disease without esophagitis 02/25/2015   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   Hyperlipidemia LDL goal <70 02/25/2015   Hypothyroidism 02/25/2015   Meniere's disease 02/25/2015   Mild intermittent asthma without complication [id number]   PCP:  Pcp, No Pharmacy:   [place], [place] - [address] [place] [address] Phone: [phone] Fax:  [phone]     Social Determinants of Health (SDOH) Interventions    Readmission Risk Interventions     View : No data to display.

## 2021-07-03 NOTE — Progress Notes (Signed)
Neurology Progress Note   S:// Patient seen and examined. On LTM overnight with no evidence of seizures.  EEG did show cortical dysfunction from the left temporal region along with generalized slowing of no specific etiology.   O:// Current vital signs: BP (!) 155/57 (BP Location: Left Arm)   Pulse 63   Temp 98.1 F (36.7 C) (Oral)   Resp 14   Ht 5\' 4"  (1.626 m)   Wt 55.8 kg   SpO2 100%   BMI 21.12 kg/m  Vital signs in last 24 hours: Temp:  [97.7 F (36.5 C)-99.3 F (37.4 C)] 98.1 F (36.7 C) (06/02 0719) Pulse Rate:  [63-114] 63 (06/02 0719) Resp:  [10-30] 14 (06/02 0719) BP: (155-187)/(54-151) 155/57 (06/02 0719) SpO2:  [97 %-100 %] 100 % (06/02 0719) Weight:  [55.8 kg] 55.8 kg (06/01 1710) General: Awake alert oriented to self but not to place HEENT: Normocephalic atraumatic Lungs: Clear Cardiovascular regular rhythm Abdomen nondistended nontender Neurological exam She is awake alert oriented to self She told me she is at her house.  When I told her she is at the hospital, she looked around and said overall yes. When I asked her the date and month, she could not tell me the date but she said it was May. She could not tell me her correct age Her speech is mildly dysarthric She has no aphasia-she is able to name, comprehend and repeat L beat with some slowing. Poor attention concentration Cranial nerves: Pupils are equal round reactive light, extraocular movements intact, visual fields full, face appears grossly symmetric, tongue and palate midline. Motor examination with antigravity strength in all 4 extremities Sensation intact to light touch all over Coordination difficult to assess given her mentation   Medications  Current Facility-Administered Medications:    acetaminophen (TYLENOL) tablet 650 mg, 650 mg, Oral, Q6H PRN, Rehman, Areeg N, DO, 650 mg at 07/03/21 0334   albuterol (PROVENTIL) (2.5 MG/3ML) 0.083% nebulizer solution 3 mL, 3 mL, Inhalation, Q6H  PRN, Rehman, Areeg N, DO   amLODipine (NORVASC) tablet 10 mg, 10 mg, Oral, Daily, Gaylan Gerold, DO   aspirin EC tablet 81 mg, 81 mg, Oral, Daily, Rehman, Areeg N, DO   cholecalciferol (VITAMIN D3) tablet 2,000 Units, 2,000 Units, Oral, Daily, Velna Ochs, MD   enoxaparin (LOVENOX) injection 40 mg, 40 mg, Subcutaneous, Q24H, Rehman, Areeg N, DO, 40 mg at 07/02/21 2045   guaiFENesin-dextromethorphan (ROBITUSSIN DM) 100-10 MG/5ML syrup 5 mL, 5 mL, Oral, Q4H PRN, Axel Filler, MD, 5 mL at 07/03/21 0334   ipratropium-albuterol (DUONEB) 0.5-2.5 (3) MG/3ML nebulizer solution 3 mL, 3 mL, Nebulization, Q6H PRN, Axel Filler, MD   labetalol (NORMODYNE) injection 10 mg, 10 mg, Intravenous, Q2H PRN, Rehman, Areeg N, DO, 10 mg at 07/03/21 0334   levETIRAcetam (KEPPRA) tablet 500 mg, 500 mg, Oral, BID, Rehman, Areeg N, DO, 500 mg at 07/03/21 0334   levothyroxine (SYNTHROID) tablet 100 mcg, 100 mcg, Oral, q morning, Rehman, Areeg N, DO, 100 mcg at 07/03/21 [id number]   losartan (COZAAR) tablet 100 mg, 100 mg, Oral, Daily, Rehman, Areeg N, DO, 100 mg at 07/02/21 1521   melatonin tablet 3 mg, 3 mg, Oral, QHS, Demaio, Alexa, MD, 3 mg at 07/02/21 2318   metoprolol succinate (TOPROL-XL) 24 hr tablet 100 mg, 100 mg, Oral, Daily, Rehman, Areeg N, DO, 100 mg at 07/02/21 1521   montelukast (SINGULAIR) tablet 10 mg, 10 mg, Oral, Daily, Rehman, Areeg N, DO   pantoprazole (PROTONIX) EC tablet 40  mg, 40 mg, Oral, Daily, Rehman, Areeg N, DO   potassium chloride (KLOR-CON) packet 40 mEq, 40 mEq, Oral, Q4H, France Ravens, MD, 40 mEq at 07/03/21 0658   potassium chloride 10 mEq in 100 mL IVPB, 10 mEq, Intravenous, Q1 Hr x 6, Demaio, Alexa, MD, Last Rate: 100 mL/hr at 07/03/21 0935, 10 mEq at 07/03/21 0935 Labs CBC    Component Value Date/Time   WBC 5.7 07/03/2021 0404   RBC 3.47 (L) 07/03/2021 0404   HGB 9.8 (L) 07/03/2021 0404   HCT 30.1 (L) 07/03/2021 0404   PLT 153 07/03/2021 0404   MCV 86.7 07/03/2021  0404   MCH 28.2 07/03/2021 0404   MCHC 32.6 07/03/2021 0404   RDW 14.6 07/03/2021 0404   LYMPHSABS 1.5 07/03/2021 0404   MONOABS 0.4 07/03/2021 0404   EOSABS 0.1 07/03/2021 0404   BASOSABS 0.0 07/03/2021 0404    CMP     Component Value Date/Time   NA 135 07/03/2021 0404   K 2.7 (LL) 07/03/2021 0404   CL 94 (L) 07/03/2021 0404   CO2 29 07/03/2021 0404   GLUCOSE 130 (H) 07/03/2021 0404   BUN 9 07/03/2021 0404   CREATININE 0.87 07/03/2021 0404   CALCIUM 8.1 (L) 07/03/2021 0404   PROT 6.7 07/02/2021 0808   ALBUMIN 3.8 07/02/2021 0808   AST 15 07/02/2021 0808   ALT 13 07/02/2021 0808   ALKPHOS 82 07/02/2021 0808   BILITOT 1.2 07/02/2021 0808   GFRNONAA >60 07/03/2021 0404  Urinalysis negative Chest x-ray: Radiology compared to 05/15/2021 with improving right basilar opacity  Imaging I have reviewed images in epic and the results pertinent to this consultation are: CT head unremarkable for acute process CT angiography head and neck with no LVO MRI of the brain with no evidence of stroke or acute process.   EEG yesterday with left-sided temporal slowing with no seizures Overnight LTM EEG with generalized slowing and left temporal slowing without any evidence of seizures.  Assessment: 81 year old with history of hypertension hyperlipidemia allergies recent respiratory infection/pneumonia for which she was on prednisone that she finished last Friday, macular degeneration, CKD stage III presented to the emergency room for evaluation of strokelike symptoms by [facility] with last known well the afternoon prior to presentation.  MRI was negative for acute stroke but EEG showed some left temporal slowing-could be postictal slowing from electrographic or clinical seizures-no witnessed seizures though. She was loaded with Keppra and started on Keppra.  Continuous EEG overnight did show generalized and left-sided slowing but clinical exam is much improved from yesterday although she still  is not oriented to place completely. I am unclear of the etiology of her encephalopathy but I would imagine some sort of seizure activity to be the underlying cause.  She was also hypertensive on arrival-hypertensive urgency could also be in the differentials.  Hypertensive urgency causing electrographic abnormalities in the brain is also possible.  Other differentials include toxic metabolic encephalopathy in the setting of recent infection On diazepam at home-unclear if taking more than prescribed  Impression: Acute encephalopathy-question underlying seizures Toxic metabolic encephalopathy in the setting of recent infection Hypertensive urgency ?  Polypharmacy  Recommendations: Discontinue LTM EEG Continue on Keppra 500 twice daily Continue frequent neurochecks Maintain seizure precautions Lower blood pressures by 20% each day for goal of normotension at discharge. Management of respiratory illness per primary team as you are. Minimize sedating medications Neurology will follow-I will sign out her care to the oncoming neurologist tomorrow.  Plan was  relayed to the primary team resident physician -- Amie Portland, MD Neurologist [facility] Pager: [phone]

## 2021-07-03 NOTE — Procedures (Addendum)
Patient Name: Crystal Rogers  MRN: [id number]  Epilepsy Attending: Charlsie Quest  Referring Physician/Provider: Milon Dikes, MD Duration: 07/02/2021 1522 to 07/03/2021 1007   Patient history: 80yo with transient speech disturbance. EEG to evaluate for seizure   Level of alertness: Awake, asleep   AEDs during EEG study: LEV   Technical aspects: This EEG study was done with scalp electrodes positioned according to the 10-20 International system of electrode placement. Electrical activity was acquired at a sampling rate of 500Hz  and reviewed with a high frequency filter of 70Hz  and a low frequency filter of 1Hz . EEG data were recorded continuously and digitally stored.    Description: The posterior dominant rhythm consists of 8 Hz activity of moderate voltage (25-35 uV) seen predominantly in posterior head regions, symmetric and reactive to eye opening and eye closing.  Sleep was characterized by vertex waves, sleep spindles (12 to 14 Hz), maximal frontocentral region.  EEG showed intermittent 3 to 5 Hz theta-delta slowing in left temporal region. Hyperventilation and photic stimulation were not performed.      ABNORMALITY - Intermittent slow, left temporal region   IMPRESSION: This study is suggestive of cortical dysfunction arising from left temporal region, non specific etiology. No seizures or epileptiform discharges were seen throughout the recording.   Jovonni Borquez 

## 2021-07-03 NOTE — Plan of Care (Signed)
Pt is alert but confused. Unable to understand her speech at times. She has been restless, trying to get out of bed but doesn't know where she is going. On call provider informed. Pt has a cough, prn cough medicine giving. Pt received melatonin to help her rest. Pt verbalized that she wanted to sleep but could not. Lights turned out, calming environment promoted.    Problem: Education: Goal: Knowledge of General Education information will improve Description: Including pain rating scale, medication(s)/side effects and non-pharmacologic comfort measures Outcome: Progressing   Problem: Health Behavior/Discharge Planning: Goal: Ability to manage health-related needs will improve Outcome: Progressing   Problem: Clinical Measurements: Goal: Ability to maintain clinical measurements within normal limits will improve Outcome: Progressing Goal: Will remain free from infection Outcome: Progressing Goal: Diagnostic test results will improve Outcome: Progressing Goal: Respiratory complications will improve Outcome: Progressing Goal: Cardiovascular complication will be avoided Outcome: Progressing   Problem: Activity: Goal: Risk for activity intolerance will decrease Outcome: Progressing   Problem: Nutrition: Goal: Adequate nutrition will be maintained Outcome: Progressing   Problem: Coping: Goal: Level of anxiety will decrease Outcome: Progressing   Problem: Elimination: Goal: Will not experience complications related to bowel motility Outcome: Progressing Goal: Will not experience complications related to urinary retention Outcome: Progressing   Problem: Pain Managment: Goal: General experience of comfort will improve Outcome: Progressing   Problem: Safety: Goal: Ability to remain free from injury will improve Outcome: Progressing   Problem: Skin Integrity: Goal: Risk for impaired skin integrity will decrease Outcome: Progressing

## 2021-07-03 NOTE — Plan of Care (Signed)

## 2021-07-03 NOTE — Progress Notes (Addendum)
Subjective:  Overnight Events: No acute events or concerns overnight.  The patient was seen and evaluated on rounds this morning. She states that she is feeling well but cannot recall why she is in the hospital or how long she has been in the hospital. She states that she has been eating well recently but does endorse that she has occasional days where she does not eat. She reports that she lives at home with her sister, Crystal Rogers.  Objective:  Vital signs in last 24 hours: Vitals:   07/02/21 2114 07/02/21 2317 07/03/21 0328 07/03/21 0719  BP: (!) 174/57 (!) 165/55 (!) 180/56 (!) 155/57  Pulse:  64  63  Resp:  16 20 14   Temp:  98.6 F (37 C) 97.7 F (36.5 C) 98.1 F (36.7 C)  TempSrc:  Oral Oral Oral  SpO2: 100% 98% 100% 100%  Weight:      Height:       Weight change:   Intake/Output Summary (Last 24 hours) at 07/03/2021 1021 Last data filed at 07/03/2021 [id number] Gross per 24 hour  Intake 764.52 ml  Output --  Net 764.52 ml    Physical Exam   Constitutional: elderly appearing, frail, in no acute distress Cardiovascular: regular rate with normal rhythm, no m/r/g Pulmonary/Chest: normal work of breathing on room air, lungs clear to auscultation bilaterally Abdominal: soft, non-tender, non-distended, bowel sounds present MSK: normal bulk and tone, no peripheral edema Skin: warm and dry. Neurological: alert and appropriately answering questions. Oriented to name and place but does not know why she is in the hospital.  Assessment/Plan:  Principal Problem:   Acute encephalopathy Active Problems:   Expressive aphasia   Uncontrolled hypertension   Hypokalemia   Hypomagnesemia   Bronchiectasis [facility])  [name] is an 63 yoF with HTN, HLD, hypothyroidism, gout, GERD, carotid artery stenosis, asthma and recent pneumonia presenting with acute encephalopathy and expressive aphasia likely secondary to electrolyte abnormalities with hypokalemia, hypocalcemia, hypomagnesemia.     Acute encephalopathy Expressive aphasia Possible post-ictal state  Electrolyte Disturbances Patient presented with confusion and expressive aphasia. Code stroke was called, negative brain MRI. EEG with some focal slowing that could indicate prior seizure. No active seizure on spot EEG. Her mentation is improved this morning and she is able to answer some questions but is unsure what brought her to the hospital. Metabolic derangement is the most likely cause of her altered mental status given hypokalemia to 2.7, low ionized calcium to 0.8, low magnesium to 1.4. Infection less likely as she is afebrile, with no leukocytosis, UA is unremarkable, lactic acid normal. She has polypharmacy with diazepam and meclizine home medications for vertigo. She was recently treated for a pneumonia and bronchiectasis with prednisone and clarithromycin, this appears resolved. Etiology is most likely multifactorial with metabolic derangements and polypharmacy.  - Loaded with keppra, continue 500 mg BID per neuro recommendations  - Seizure precautions  - Repeat BMP for noon - Replete potassium, magnesium, calcium as needed   Recent pneumonia  Bronchiectasis  Asthma Recently seen by outpatient pulmonology for productive cough and abnormal chest radiograph findings of a RLL opacity on 3/30 which persisted on repeat chest radiograph on 4/14 with worsening right apical opacity. CT chest on 5/25 showed progressive RML and RLL bronchiectasis with diffuse opacification of RLL, consistent with mucoid impaction, aspiration or infection. Also noted to have diffuse reticulonodular opacities within right middle and lower lobes. She was  treated with prednisone 20 mg daily for 5 days (last  dose reportedly 5/28). She was also on clarithromycin from 4/18 to 5/31. She is continuing to have a productive cough, though chest radiograph shows improving right basilar opacity per radiology, though we are unable to see prior imaging. Will  continue to monitor.  - Continue combivent  - Maintain oxygen saturations >92% - Pulmonary hygiene      Uncontrolled HTN SBP >180 on admission. Currently 155/57. She denies any headaches, chest pain but is endorsing some SOB which could be related to asthma and recent pneumonia. Elevated blood pressures could be contributing to AMS but less likely without signs of end organ damage or pulmonary edema.  -Will resume home metoprolol succinate, losartan. Holding HCTZ given hypokalemia. -Continue Amlodipine 10 mg qd.   History of Meniere's Disease On diazepam 5 mg q8h and meclizine as needed for severe vertigo. Would recommend discontinuing both as this could be a possible cause of patient's confusion.    HLD Carotid artery stenosis -Continue atorvastatin    Normocytic anemia Hemoglobin trended down slightly from 10.5 to 9.8. No signs of active bleeding. Patient had iron studies done at an outside hospital on 06/22/21 that showed low saturation % at 12. Patient's last colonoscopy was in 2015, and she was recommended for 10 year follow-up at that time. Will consider outpatient colonoscopy at discharge. Plan to trend CBC here. -Trend CBC   Hypothyroidism -Continue levothyroxine    Gout Recently had a gout flare and was on colchicine.  Diet: Regular VTE: Lovenox IVF: None Code: Full   Prior to Admission Living Arrangement: Home Anticipated Discharge Location: TBD Barriers to Discharge: Medical Stability Dispo: Anticipated discharge in approximately 1-2 day(s).

## 2021-07-03 NOTE — Progress Notes (Signed)
Pt confused, agitated, insistent she needed to get up out of bed. Pt stating she needed to pee but couldn't get it out. Pt assisted to bedside commode, pt voided immediately and had small bowel movement. Pt stated she felt better but does continue to try to get out of bed.

## 2021-07-03 NOTE — Progress Notes (Signed)
LTM maint complete - no skin breakdown [facility] monitored, Event button test confirmed by [facility]. ? ?

## 2021-07-04 DIAGNOSIS — I10 Essential (primary) hypertension: Secondary | ICD-10-CM

## 2021-07-04 DIAGNOSIS — E876 Hypokalemia: Secondary | ICD-10-CM

## 2021-07-04 DIAGNOSIS — R4701 Aphasia: Secondary | ICD-10-CM

## 2021-07-04 DIAGNOSIS — G9340 Encephalopathy, unspecified: Secondary | ICD-10-CM | POA: Diagnosis not present

## 2021-07-04 LAB — BASIC METABOLIC PANEL
Anion gap: 8 (ref 5–15)
BUN: 11 mg/dL (ref 8–23)
CO2: 28 mmol/L (ref 22–32)
Calcium: 9.6 mg/dL (ref 8.9–10.3)
Chloride: 98 mmol/L (ref 98–111)
Creatinine, Ser: 0.83 mg/dL (ref 0.44–1.00)
GFR, Estimated: >60 mL/min (ref >60)
Glucose, Bld: 99 mg/dL (ref 70–99)
Potassium: 4.8 mmol/L (ref 3.5–5.1)
Sodium: 134 mmol/L — ABNORMAL LOW (ref 135–145)

## 2021-07-04 LAB — MAGNESIUM: Magnesium: 2.1 mg/dL (ref 1.7–2.4)

## 2021-07-04 LAB — URINE CULTURE: Culture: 50000 — AB

## 2021-07-04 MED ORDER — METOPROLOL SUCCINATE ER 50 MG PO TB24
50.0000 mg | ORAL_TABLET | Freq: Every day | ORAL | Status: DC
  Administered 2021-07-05 – 2021-07-08 (×4): 50 mg via ORAL
  Filled 2021-07-04 (×4): qty 1

## 2021-07-04 NOTE — Evaluation (Signed)
Occupational Therapy Evaluation Patient Details Name: Crystal Rogers MRN: [id number] DOB: Apr 11, 1940 Today's Date: 07/04/2021   History of Present Illness 81yo woman with HTN, HLD, hypothyroidism, carotid artery stenosis, and recent pneumonia, who presented with acute encephalopathy and expressive aphasia, with negative stroke workup, multiple electrolyte abnormalities, and left temporal cortical dysfunction on EEG.   Clinical Impression   This 81 yo female admitted with above presents to acute OT with PLOF per her report of doing all of her basic ADLs, most of IADLs, driving, cooking, and tending somewhat to her sister who is 483 and lives with her. Currently she is setup/S-minA for basic ADLs with increased confusion. She will continue to benefit from acute OT with follow up at SNF unless mentation starts to clear and remains clear throughout the day [facility]. We will continue to follow.      Recommendations for follow up therapy are one component of a multi-disciplinary discharge planning process, led by the attending physician.  Recommendations may be updated based on patient status, additional functional criteria and insurance authorization.   Follow Up Recommendations  Skilled nursing-short term rehab (<3 hours/day)    Assistance Recommended at Discharge Frequent or constant Supervision/Assistance  Patient can return home with the following A lot of help with walking and/or transfers;A lot of help with bathing/dressing/bathroom;Assistance with cooking/housework;Assistance with feeding;Assist for transportation;Direct supervision/assist for financial management;Direct supervision/assist for medications management    Functional Status Assessment  Patient has had a recent decline in their functional status and demonstrates the ability to make significant improvements in function in a reasonable and predictable amount of time.  Equipment Recommendations  Other (comment)  (TBD next venue)       Precautions / Restrictions Precautions Precautions: Fall Restrictions Weight Bearing Restrictions: No      Mobility Bed Mobility Overal bed mobility: Needs Assistance Bed Mobility: Supine to Sit     Supine to sit: Max assist     General bed mobility comments: Max A to scoot out to EOB    Transfers Overall transfer level: Needs assistance Equipment used: Rolling walker (2 wheels) Transfers: Sit to/from Stand Sit to Stand: Min assist                  Balance Overall balance assessment: Needs assistance Sitting-balance support: No upper extremity supported, Feet supported Sitting balance-Leahy Scale: Fair     Standing balance support: No upper extremity supported, During functional activity Standing balance-Leahy Scale: Fair                             ADL either performed or assessed with clinical judgement   ADL Overall ADL's : Needs assistance/impaired   Eating/Feeding Details (indicate cue type and reason): Did not want to eat lunhc "too Crystal", I told her it was 4:15 in the afternoon and she did not believe me. Per sitter she ate very little of her breakfast and no lunch thus far. Grooming: Wash/dry hands;Min guard;Standing Grooming Details (indicate cue type and reason): VCs for where soap and paper towels were (reached and picked up a pair of the disposible underwear to use as if a wash cloth--she has her glasses on) Upper Body Bathing: Set up;Supervision/ safety;Sitting   Lower Body Bathing: Minimal assistance;Sit to/from stand   Upper Body Dressing : Set up;Supervision/safety;Sitting   Lower Body Dressing: Min guard;Sit to/from stand   Toilet Transfer: Minimal assistance;Ambulation;Rolling walker (2 wheels) Toilet Transfer Details (indicate cue  type and reason): and without AD--a little more safer with RW Toileting- Clothing Manipulation and Hygiene: Minimal assistance;Sit to/from stand Toileting - Clothing  Manipulation Details (indicate cue type and reason): try to wipe front through her disposible underwear because she initially reached too low and went under it instead of above it, needed VCs to correct             Vision Baseline Vision/History: 1 Wears glasses Ability to See in Adequate Light: 0 Adequate Patient Visual Report: No change from baseline              Pertinent Vitals/Pain Pain Assessment Pain Assessment: No/denies pain     Hand Dominance Right   Extremity/Trunk Assessment Upper Extremity Assessment Upper Extremity Assessment: Overall WFL for tasks assessed           Communication Communication Communication: No difficulties   Cognition Arousal/Alertness: Lethargic Behavior During Therapy: Flat affect, Impulsive Overall Cognitive Status: No family/caregiver present to determine baseline cognitive functioning                                 General Comments: Shows decreased safety awareness and awareness of deficits (use of RW and putting it aside to furniture/wall walk. Not oriented at all (even on second attempt after I re-oriented her)                Home Living Family/patient expects to be discharged to:: Skilled nursing facility Living Arrangements: Other relatives (sister) Available Help at Discharge: Family;Available 24 hours/day Type of Home: House Home Access: Stairs to enter [employer] of Steps: 1 Entrance Stairs-Rails: None       Bathroom Shower/Tub: Chief Strategy Officer: Standard     Home Equipment: Agricultural consultant (2 wheels)          Prior Functioning/Environment Prior Level of Function : Independent/Modified Independent;Driving             Mobility Comments: No device used, does the shopping for her and her sister ADLs Comments: Ind with ADLs. does most of the cooking at home.        OT Problem List: Impaired balance (sitting and/or standing);Decreased cognition;Decreased  safety awareness      OT Treatment/Interventions: Self-care/ADL training;DME and/or AE instruction;Patient/family education;Balance training    OT Goals(Current goals can be found in the care plan section) Acute Rehab OT Goals Patient Stated Goal: unable to state when I asked her "I don't know" OT Goal Formulation: Patient unable to participate in goal setting Time For Goal Achievement: 07/25/21 Potential to Achieve Goals: Fair  OT Frequency: Min 2X/week       AM-PAC OT "6 Clicks" Daily Activity     Outcome Measure Help from another person eating meals?: Total (not eating) Help from another person taking care of personal grooming?: A Little Help from another person toileting, which includes using toliet, bedpan, or urinal?: A Lot Help from another person bathing (including washing, rinsing, drying)?: A Lot Help from another person to put on and taking off regular upper body clothing?: A Little Help from another person to put on and taking off regular lower body clothing?: A Lot 6 Click Score: 13   End of Session Equipment Utilized During Treatment: Gait belt;Rolling walker (2 wheels)  Activity Tolerance: Patient limited by lethargy (and confusion) Patient left: in chair;with call bell/phone within reach (sitter in room)  OT Visit Diagnosis: Unsteadiness on feet (R26.81);Other abnormalities  of gait and mobility (R26.89);Muscle weakness (generalized) (M62.81);Other symptoms and signs involving cognitive function                Time: 1558-1620 OT Time Calculation (min): 22 min Charges:  OT General Charges $OT Visit: 1 Visit OT Evaluation $OT Eval Moderate Complexity: 1 Mod  Ignacia Palma, OTR/L Acute [employer] [facility] [phone] Office [phone]    Evette Georges 07/04/2021, 4:48 PM

## 2021-07-04 NOTE — Plan of Care (Signed)
Pt is alert oriented x 2. Pt rested at the beginning of the shift. After midnight pt was awake, towards morning pt became restless and refused medications. Pt verbalized to sitter that she had a headache but then refused medication when given. Pt had cough, pt offered cough medicine, pt stated she wanted it. Medication brought to room and pt refused it. Pt also refused morning medication insisting she was going home.   Problem: Education: Goal: Knowledge of General Education information will improve Description: Including pain rating scale, medication(s)/side effects and non-pharmacologic comfort measures Outcome: Progressing   Problem: Health Behavior/Discharge Planning: Goal: Ability to manage health-related needs will improve Outcome: Progressing   Problem: Clinical Measurements: Goal: Ability to maintain clinical measurements within normal limits will improve Outcome: Progressing Goal: Will remain free from infection Outcome: Progressing Goal: Diagnostic test results will improve Outcome: Progressing Goal: Respiratory complications will improve Outcome: Progressing Goal: Cardiovascular complication will be avoided Outcome: Progressing   Problem: Activity: Goal: Risk for activity intolerance will decrease Outcome: Progressing   Problem: Nutrition: Goal: Adequate nutrition will be maintained Outcome: Progressing   Problem: Coping: Goal: Level of anxiety will decrease Outcome: Progressing   Problem: Elimination: Goal: Will not experience complications related to bowel motility Outcome: Progressing Goal: Will not experience complications related to urinary retention Outcome: Progressing   Problem: Pain Managment: Goal: General experience of comfort will improve Outcome: Progressing   Problem: Safety: Goal: Ability to remain free from injury will improve Outcome: Progressing   Problem: Skin Integrity: Goal: Risk for impaired skin integrity will decrease Outcome:  Progressing

## 2021-07-04 NOTE — Progress Notes (Addendum)
Neurology Progress Note   S:// Patient seen and examined.  Has a sitter today. No seizures reported. She is sitting up on side of bed asking to go home.   O:// Current vital signs: BP (!) 163/56 (BP Location: Right Arm)   Pulse 75   Temp 98.9 F (37.2 C) (Oral)   Resp 16   Ht 5\' 4"  (1.626 m)   Wt 55.8 kg   SpO2 99%   BMI 21.12 kg/m  Vital signs in last 24 hours: Temp:  [97.4 F (36.3 C)-99.2 F (37.3 C)] 98.9 F (37.2 C) (06/03 0720) Pulse Rate:  [58-75] 75 (06/03 0720) Resp:  [14-20] 16 (06/03 0720) BP: (129-179)/(39-56) 163/56 (06/03 0720) SpO2:  [96 %-99 %] 99 % (06/03 0720) General: sitting up in bed. NAD. HEENT: Normocephalic atraumatic Neurological exam Awake alert oriented to self, she knew the year, month and thought it was Sunday today. She knew the president. She said she was in [employer] but not in the hospital.Cranial nerves: Pupils are equal round reactive light, extraocular movements intact, visual fields full, face appears grossly symmetric, tongue and palate midline. Motor examination is non focal. Sensation intact to light touch all over No ataxia.   Medications  Current Facility-Administered Medications:    acetaminophen (TYLENOL) tablet 650 mg, 650 mg, Oral, Q6H PRN, Rehman, Areeg N, DO, 650 mg at 07/03/21 0334   albuterol (PROVENTIL) (2.5 MG/3ML) 0.083% nebulizer solution 3 mL, 3 mL, Inhalation, Q6H PRN, Rehman, Areeg N, DO   amLODipine (NORVASC) tablet 10 mg, 10 mg, Oral, Daily, Gaylan Gerold, DO, 10 mg at 07/03/21 1151   aspirin EC tablet 81 mg, 81 mg, Oral, Daily, Rehman, Areeg N, DO, 81 mg at 07/03/21 1150   cholecalciferol (VITAMIN D3) tablet 2,000 Units, 2,000 Units, Oral, Daily, Velna Ochs, MD, 2,000 Units at 07/03/21 1150   enoxaparin (LOVENOX) injection 40 mg, 40 mg, Subcutaneous, Q24H, Rehman, Areeg N, DO, 40 mg at 07/03/21 2031   guaiFENesin-dextromethorphan (ROBITUSSIN DM) 100-10 MG/5ML syrup 5 mL, 5 mL, Oral, Q4H PRN, Axel Filler, MD, 5 mL at 07/03/21 2031   ipratropium-albuterol (DUONEB) 0.5-2.5 (3) MG/3ML nebulizer solution 3 mL, 3 mL, Nebulization, Q6H PRN, Axel Filler, MD   levETIRAcetam (KEPPRA) tablet 500 mg, 500 mg, Oral, BID, Rehman, Areeg N, DO, 500 mg at 07/03/21 2031   levothyroxine (SYNTHROID) tablet 100 mcg, 100 mcg, Oral, q morning, Rehman, Areeg N, DO, 100 mcg at 07/03/21 [id number]   losartan (COZAAR) tablet 100 mg, 100 mg, Oral, Daily, Rehman, Areeg N, DO, 100 mg at 07/03/21 1150   melatonin tablet 3 mg, 3 mg, Oral, QHS, Demaio, Alexa, MD, 3 mg at 07/03/21 2031   metoprolol succinate (TOPROL-XL) 24 hr tablet 100 mg, 100 mg, Oral, Daily, Rehman, Areeg N, DO, 100 mg at 07/03/21 1150   montelukast (SINGULAIR) tablet 10 mg, 10 mg, Oral, Daily, Rehman, Areeg N, DO, 10 mg at 07/03/21 1151   pantoprazole (PROTONIX) EC tablet 40 mg, 40 mg, Oral, Daily, Rehman, Areeg N, DO, 40 mg at 07/03/21 1150   potassium chloride (KLOR-CON) packet 40 mEq, 40 mEq, Oral, BID, Gaylan Gerold, DO, 40 mEq at 07/03/21 2032 Labs CBC    Component Value Date/Time   WBC 5.7 07/03/2021 0404   RBC 3.47 (L) 07/03/2021 0404   HGB 9.8 (L) 07/03/2021 0404   HCT 30.1 (L) 07/03/2021 0404   PLT 153 07/03/2021 0404   MCV 86.7 07/03/2021 0404   MCH 28.2 07/03/2021 0404   MCHC 32.6  07/03/2021 0404   RDW 14.6 07/03/2021 0404   LYMPHSABS 1.5 07/03/2021 0404   MONOABS 0.4 07/03/2021 0404   EOSABS 0.1 07/03/2021 0404   BASOSABS 0.0 07/03/2021 0404    CMP     Component Value Date/Time   NA 134 (L) 07/04/2021 0047   K 4.8 07/04/2021 0047   CL 98 07/04/2021 0047   CO2 28 07/04/2021 0047   GLUCOSE 99 07/04/2021 0047   BUN 11 07/04/2021 0047   CREATININE 0.83 07/04/2021 0047   CALCIUM 9.6 07/04/2021 0047   PROT 6.7 07/02/2021 0808   ALBUMIN 3.8 07/02/2021 0808   AST 15 07/02/2021 0808   ALT 13 07/02/2021 0808   ALKPHOS 82 07/02/2021 0808   BILITOT 1.2 07/02/2021 0808   GFRNONAA >60 07/04/2021 0047  Urinalysis  negative Chest x-ray: Radiology compared to 05/15/2021 with improving right basilar opacity  Imaging reviewed images in epic  CT head unremarkable for acute process CT angiography head and neck with no LVO MRI of the brain with no evidence of stroke or acute process.   EEG yesterday with left-sided temporal slowing with no seizures Overnight LTM EEG with generalized slowing and left temporal slowing without any evidence of seizures.  Assessment: 81 year old with history of hypertension hyperlipidemia allergies recent respiratory infection/pneumonia for which she was on prednisone that she finished last Friday, macular degeneration, CKD stage III presented to the emergency room for evaluation of strokelike symptoms by [facility] with last known well the afternoon prior to presentation.  MRI was negative for acute stroke but EEG showed some left temporal slowing-could be postictal slowing from electrographic or clinical seizures-no witnessed seizures though. She was loaded with Keppra and started on Keppra.  Continuous EEG with generalized and left-sided slowing   Impression: Encephalopathy improved. Unclear of her baseline and if there is baseline dementia especially in setting of benzo use at home.  Recommendations: Continue on Keppra 500 twice daily.  Minimize sedating medications Neurology will sign off.    Plan was relayed to the primary team   Total of 35 mins spent reviewing chart, discussion with patient and family on prognosis, Dx and plan. Discussed case with patient's nurse. Reviewed Imaging personally.

## 2021-07-04 NOTE — Evaluation (Signed)
Physical Therapy Evaluation Patient Details Name: Crystal Rogers MRN: [id number] DOB: May 02, 1940 Today's Date: 07/04/2021  History of Present Illness  81yo woman with HTN, HLD, hypothyroidism, carotid artery stenosis, and recent pneumonia, who presented with acute encephalopathy and expressive aphasia, with negative stroke workup, multiple electrolyte abnormalities, and left temporal cortical dysfunction on EEG.  Clinical Impression  Pt admitted with above diagnosis. High fall risk based on objective testing with BERG and DGI. Ambulates in hallway at supervision level 200' x2 and is steady with a rolling walker but shows some instability without AD. Cognition seems to be improving, oriented to month, year, and location but unsure why she is here, additionally shows delayed processing. Spoke with sister who confirmed patient's report of high independence prior to admission including driving, shopping, and cooking for the two of them at home. Suspect patient will progress well and be safe to d/c home but may benefit from a HHPT evaluation and is agreeable to use a RW due to new onset of mild instability with gait.  Pt currently with functional limitations due to the deficits listed below (see PT Problem List). Pt will benefit from skilled PT to increase their independence and safety with mobility to allow discharge to the venue listed below.          Recommendations for follow up therapy are one component of a multi-disciplinary discharge planning process, led by the attending physician.  Recommendations may be updated based on patient status, additional functional criteria and insurance authorization.  Follow Up Recommendations Home health PT (Home safety assessment)    Assistance Recommended at Discharge Set up Supervision/Assistance (Initially (can be provided from sister))  Patient can return home with the following  Assistance with cooking/housework;Direct supervision/assist for medications  management;Assist for transportation    Equipment Recommendations None recommended by PT  Recommendations for Other Services  OT consult    Functional Status Assessment Patient has had a recent decline in their functional status and demonstrates the ability to make significant improvements in function in a reasonable and predictable amount of time.     Precautions / Restrictions Precautions Precautions: Fall Restrictions Weight Bearing Restrictions: No      Mobility  Bed Mobility               General bed mobility comments: In recliner    Transfers Overall transfer level: Needs assistance Equipment used: None, Rolling walker (2 wheels) Transfers: Sit to/from Stand Sit to Stand: Supervision           General transfer comment: Supervision for safety, mild sway upon standing able to self correct and stablizes further with use of RW.    Ambulation/Gait Ambulation/Gait assistance: Min guard, Supervision Gait Distance (Feet): 200 Feet (x2) Assistive device: Rolling walker (2 wheels), None Gait Pattern/deviations: Step-through pattern, Drifts right/left, Staggering right, Staggering left Gait velocity: decreased     General Gait Details: First bout without AD pt shows some intermittent drift and a couple episodes of staggering but able to self correct. Improved markedly with use of RW for support, cues for proximity to device.  Stairs            Wheelchair Mobility    Modified Rankin (Stroke Patients Only)       Balance Overall balance assessment: Needs assistance Sitting-balance support: No upper extremity supported, Feet supported Sitting balance-Leahy Scale: Good  Standardized Balance Assessment Standardized Balance Assessment : Berg Balance Test, Dynamic Gait Index Berg Balance Test Sit to Stand: Able to stand  independently using hands Standing Unsupported: Able to stand 2 minutes with supervision Sitting  with Back Unsupported but Feet Supported on Floor or Stool: Able to sit safely and securely 2 minutes Stand to Sit: Controls descent by using hands Transfers: Able to transfer safely, definite need of hands Standing Unsupported with Eyes Closed: Able to stand 10 seconds with supervision Standing Ubsupported with Feet Together: Able to place feet together independently and stand for 1 minute with supervision From Standing, Reach Forward with Outstretched Arm: Can reach forward >12 cm safely (5") From Standing Position, Pick up Object from Floor: Able to pick up shoe, needs supervision From Standing Position, Turn to Look Behind Over each Shoulder: Looks behind from both sides and weight shifts well Turn 360 Degrees: Able to turn 360 degrees safely but slowly Standing Unsupported, Alternately Place Feet on Step/Stool: Able to complete >2 steps/needs minimal assist Standing Unsupported, One Foot in Front: Able to plae foot ahead of the other independently and hold 30 seconds Standing on One Leg: Tries to lift leg/unable to hold 3 seconds but remains standing independently Total Score: 39 Dynamic Gait Index Level Surface: Mild Impairment Change in Gait Speed: Mild Impairment Gait with Horizontal Head Turns: Mild Impairment Gait with Vertical Head Turns: Mild Impairment Gait and Pivot Turn: Normal Step Over Obstacle: Moderate Impairment Step Around Obstacles: Mild Impairment Steps: Mild Impairment Total Score: 16       Pertinent Vitals/Pain Pain Assessment Pain Assessment: No/denies pain    Home Living Family/patient expects to be discharged to:: Private residence Living Arrangements: Other relatives (sister) Available Help at Discharge: Family;Available 24 hours/day Type of Home: House Home Access: Stairs to enter Entrance Stairs-Rails: None Entrance Stairs-Number of Steps: 1   Home Layout: One level Home Equipment: Agricultural consultantolling Walker (2 wheels)      Prior Function Prior Level of  Function : Independent/Modified Independent;Driving             Mobility Comments: No device used, does the shopping for her and her sister ADLs Comments: Ind with ADLs. does most of the cooking at home.     Hand Dominance        Extremity/Trunk Assessment   Upper Extremity Assessment Upper Extremity Assessment: Defer to OT evaluation    Lower Extremity Assessment Lower Extremity Assessment: Generalized weakness       Communication   Communication: No difficulties  Cognition Arousal/Alertness: Awake/alert Behavior During Therapy: WFL for tasks assessed/performed Overall Cognitive Status: No family/caregiver present to determine baseline cognitive functioning                                 General Comments: Shows some delayed processing, eventually able to recall month, year, and location. Does not recall coming to hospital. Tearful at times and would like to go home.        General Comments General comments (skin integrity, edema, etc.): Spoke with sister over the phone. Confirms patients independence level and PLOF.    Exercises     Assessment/Plan    PT Assessment Patient needs continued PT services  PT Problem List Decreased strength;Decreased balance;Decreased mobility;Decreased coordination;Decreased cognition;Decreased knowledge of use of DME;Decreased safety awareness       PT Treatment Interventions DME instruction;Gait training;Stair training;Functional mobility training;Therapeutic activities;Therapeutic exercise;Balance training;Neuromuscular re-education;Cognitive remediation;Patient/family education  PT Goals (Current goals can be found in the Care Plan section)  Acute Rehab PT Goals Patient Stated Goal: Go home PT Goal Formulation: With patient/family Time For Goal Achievement: 07/18/21 Potential to Achieve Goals: Good    Frequency Min 3X/week     Co-evaluation               AM-PAC PT "6 Clicks" Mobility  Outcome  Measure Help needed turning from your back to your side while in a flat bed without using bedrails?: None Help needed moving from lying on your back to sitting on the side of a flat bed without using bedrails?: None Help needed moving to and from a bed to a chair (including a wheelchair)?: None Help needed standing up from a chair using your arms (e.g., wheelchair or bedside chair)?: None Help needed to walk in hospital room?: A Little Help needed climbing 3-5 steps with a railing? : A Little 6 Click Score: 22    End of Session Equipment Utilized During Treatment: Gait belt Activity Tolerance: Patient tolerated treatment well Patient left: in chair;with call bell/phone within reach;with nursing/sitter in room   PT Visit Diagnosis: Unsteadiness on feet (R26.81);Other abnormalities of gait and mobility (R26.89);Muscle weakness (generalized) (M62.81);Difficulty in walking, not elsewhere classified (R26.2)    Time: 1029-1100 PT Time Calculation (min) (ACUTE ONLY): 31 min   Charges:   PT Evaluation $PT Eval Low Complexity: 1 Low PT Treatments $Gait Training: 8-22 mins        Candie Mile, PT   Ellouise Newer 07/04/2021, 11:24 AM

## 2021-07-04 NOTE — Progress Notes (Addendum)
IMTS Daily Note  Subjective:   Overnight, she had several <2 second sinus pauses on telemetry. Asymptomatic and sleeping at the time.   She feels great today. She's sitting in her chair, had just eaten breakfast. No chest pain, shortness of breath, or pain anywhere. We talked about why she's in the hospital, she had no idea. Was previously living with her sister and is hoping to return home without needing rehab.   Objective:  Vital signs in last 24 hours: Vitals:   07/03/21 1920 07/03/21 2316 07/04/21 0404 07/04/21 0720  BP: (!) 149/54 (!) 129/39 (!) 144/45 (!) 163/56  Pulse: 71 (!) 58 68 75  Resp: 16 20 15 16   Temp: (!) 97.4 F (36.3 C) 97.6 F (36.4 C) 98 F (36.7 C) 98.9 F (37.2 C)  TempSrc: Oral Oral Oral Oral  SpO2: 99% 96% 98% 99%  Weight:      Height:       Exam: She appears well overall, sitting comfortably in chair, in no acute distress. Heart is regular rate & rhythm with no murmurs. Lungs clear bilaterally. She's alert and oriented to self, place, but not year (1823) or situation.   EEG- Cortical dysfunction from left temporal region, generalized slowing  Assessment/Plan:  Principal Problem:   Acute encephalopathy Active Problems:   Uncontrolled hypertension   Hypokalemia   Hypomagnesemia   Stage 3a chronic kidney disease ([place])  81yo woman with HTN, HLD, hypothyroidism, carotid artery stenosis, and recent pneumonia, who presented with acute encephalopathy and expressive aphasia, with negative stroke workup, multiple electrolyte abnormalities, and left temporal cortical dysfunction on EEG, now improving.  1.) Acute encephalopathy, likely multifactorial from her electrolyte abnormalities, polypharmacy, and possibly seizure activity. Improving. - Neurology was consulted, and I spoke with Dr. Reeves Forth today. He recommends continuing Keppra 500mg  BID for now and at discharge, with outpatient Neuro follow-up to see if Keppra is needed longterm.  - Decrease frequency  of BMP with Mg and Ca to daily, as electrolytes have normalized this morning  - Stop home Diazepam and Meclizine for now. I anticipate we will stop these at discharge - PT/OT consulted, and she's ready to work with them today   2.) Sinus pauses on telemetry, asymptomatic - I think these could be related to underlying sleep apnea. No additional inpatient workup needed, but can consider outpatient sleep study  3.) Hypertension, uncontrolled on admission, now controlled - Patient's systolic blood pressures have been normal to elevated, but her diastolic pressures have been 30s-50s in the past 24 hours, so I don't want to increase her blood pressure medicines - Stop prn IV Labetalol now - Continue home Metoprolol Succinate 100mg  daily, Losartan 100, and Amlodipine 10 - Continue to hold home HCTZ - since she presented with hyponatremia & hypokalemia, I don't think this is a good medicine for her   4.) Normocytic anemia, no active signs of bleeding - Repeat CBC tomorrow   5.) Cough, in the setting of chronic asthma & recent pneumonia\ - Continue Duonebs and Albuterol nebs as needed for wheezing  - Continue Singulair   Chronic Stable Issues   6.) Meniere's Disease - Holding home Diazepam & Meclizine, as above  7.) Hypothyroidism - continue home Levothyroxine 100  8.) Carotid artery stenosis - Continue home aspirin 81mg  daily - I don't see her on a statin, will discuss with patient and family   Core Measures - Diet: Regular - VTE ppx: Lovenox 40 daily - Code: Full    Dispo: Anticipated discharge  in approximately 1-2 days, pending PT/OT assessment and safe discharge plan   [name], MD 07/04/2021, 7:57 AM    11:13 Addendum: Patient had some asymptomatic bradycardia to 30s this morning. Will decrease her Metoprolol from 100mg  daily to 50mg  daily and renew her order for telemetry.

## 2021-07-05 LAB — BASIC METABOLIC PANEL
Anion gap: 7 (ref 5–15)
BUN: 15 mg/dL (ref 8–23)
CO2: 26 mmol/L (ref 22–32)
Calcium: 9 mg/dL (ref 8.9–10.3)
Chloride: 97 mmol/L — ABNORMAL LOW (ref 98–111)
Creatinine, Ser: 1.12 mg/dL — ABNORMAL HIGH (ref 0.44–1.00)
GFR, Estimated: 49 mL/min — ABNORMAL LOW (ref >60)
Glucose, Bld: 94 mg/dL (ref 70–99)
Potassium: 4.1 mmol/L (ref 3.5–5.1)
Sodium: 130 mmol/L — ABNORMAL LOW (ref 135–145)

## 2021-07-05 LAB — CBC
HCT: 28.3 % — ABNORMAL LOW (ref 36.0–46.0)
Hemoglobin: 9.6 g/dL — ABNORMAL LOW (ref 12.0–15.0)
MCH: 29.4 pg (ref 26.0–34.0)
MCHC: 33.9 g/dL (ref 30.0–36.0)
MCV: 86.5 fL (ref 80.0–100.0)
Platelets: 144 10*3/uL — ABNORMAL LOW (ref 150–400)
RBC: 3.27 MIL/uL — ABNORMAL LOW (ref 3.87–5.11)
RDW: 14.4 % (ref 11.5–15.5)
WBC: 6.4 10*3/uL (ref 4.0–10.5)
nRBC: 0 % (ref 0.0–0.2)

## 2021-07-05 MED ORDER — SODIUM CHLORIDE 0.9 % IV SOLN: INTRAVENOUS | Status: AC

## 2021-07-05 NOTE — Plan of Care (Signed)
  Problem: Education: Goal: Knowledge of General Education information will improve Description: Including pain rating scale, medication(s)/side effects and non-pharmacologic comfort measures Outcome: Not Progressing   Problem: Health Behavior/Discharge Planning: Goal: Ability to manage health-related needs will improve Outcome: Not Progressing   Problem: Clinical Measurements: Goal: Ability to maintain clinical measurements within normal limits will improve Outcome: Not Progressing Goal: Will remain free from infection Outcome: Progressing Goal: Respiratory complications will improve Outcome: Not Applicable Goal: Cardiovascular complication will be avoided Outcome: Not Applicable

## 2021-07-05 NOTE — Progress Notes (Signed)
Occupational Therapy Treatment Patient Details Name: Crystal OsmondCarrie Rogers MRN: [id number] DOB: March 26, 1940 Today's Date: 07/05/2021   History of present illness 80yo woman with HTN, HLD, hypothyroidism, carotid artery stenosis, and recent pneumonia, who presented with acute encephalopathy and expressive aphasia, with negative stroke workup, multiple electrolyte abnormalities, and left temporal cortical dysfunction on EEG.   OT comments  This 81 yo female seen today at request of MD to see if she could possibly be able to go home today. Pt is still needing A and is confused (both of which per her chart is not her normal). She is unsafe to D/C home to care for herself much less for her sister and do the cooking and driving. She will continue to benefit from acute OT with follow up at SNF.   Recommendations for follow up therapy are one component of a multi-disciplinary discharge planning process, led by the attending physician.  Recommendations may be updated based on patient status, additional functional criteria and insurance authorization.    Follow Up Recommendations  Skilled nursing-short term rehab (<3 hours/day)    Assistance Recommended at Discharge Frequent or constant Supervision/Assistance  Patient can return home with the following  A little help with walking and/or transfers;A little help with bathing/dressing/bathroom;Assistance with feeding;Help with stairs or ramp for entrance;Assist for transportation;Assistance with cooking/housework;Direct supervision/assist for financial management;Direct supervision/assist for medications management   Equipment Recommendations  Other (comment) (TBD next venue)       Precautions / Restrictions Precautions Precautions: Fall Restrictions Weight Bearing Restrictions: No       Mobility Bed Mobility Overal bed mobility: Needs Assistance Bed Mobility: Supine to Sit     Supine to sit: Min guard          Transfers Overall transfer level:  Needs assistance Equipment used: Rolling walker (2 wheels) Transfers: Sit to/from Stand Sit to Stand: Min assist                 Balance Overall balance assessment: Needs assistance Sitting-balance support: No upper extremity supported, Feet supported Sitting balance-Leahy Scale: Fair     Standing balance support: No upper extremity supported, During functional activity Standing balance-Leahy Scale: Fair                             ADL either performed or assessed with clinical judgement   ADL Overall ADL's : Needs assistance/impaired     Grooming: Wash/dry hands;Wash/dry face;Oral care;Standing Grooming Details (indicate cue type and reason): Pt appropriately turned on hot water and checked temp with her hand, left water running and started to brush teeth. When she finished this she took washcloth and started washing face (but it did not have water on it). When asked if she wanted water on it she said no. Turned to walk towards bed without use fo RW and had to cue her to turn water off before proceeding. Asked her about using RW and she no so I let her try to take a couple of steps (c/o pain in mid left thigh--new at that moment, had not c/o of that before), pt limping and very unsafe so handed the RW back to her and she ambulated back to bed (no c/o LLE hurtin her)                 Toilet Transfer: Minimal assistance;Ambulation;Rolling walker (2 wheels)   Toileting- Clothing Manipulation and Hygiene: Minimal assistance;Sit to/from stand Toileting - ArchitectClothing Manipulation Details (indicate cue  type and reason): wiped sitting down from the back this time (so was more safe with this)            Extremity/Trunk Assessment Upper Extremity Assessment Upper Extremity Assessment: Overall WFL for tasks assessed            Vision Baseline Vision/History: 1 Wears glasses Ability to See in Adequate Light: 0 Adequate Patient Visual Report: No change from  baseline Vision Assessment?: Vision impaired- to be further tested in functional context Additional Comments: running into items on left with RW          Cognition Arousal/Alertness: Awake/alert Behavior During Therapy: Flat affect Overall Cognitive Status: No family/caregiver present to determine baseline cognitive functioning                                 General Comments: Continues to show decreased safety awareness and awareness of deficits (use of RW and putting it aside to furniture/wall walk--could not do this today due to c/o of left mid thigh hurting). Only oriented to self (with attempt to re-orient she said, "I don't know about that"). Asked "what games we were playing" when I was talking with her to try and convince her to get up and work with me. Running into sink and bed on left side with RW (did self correct RW once she did this).                   Pertinent Vitals/ Pain       Pain Assessment Pain Assessment: PAINAD Breathing: normal Negative Vocalization: occasional moan/groan, low speech, negative/disapproving quality Facial Expression: smiling or inexpressive Body Language: tense, distressed pacing, fidgeting Consolability: no need to console PAINAD Score: 2 Pain Location: left mid thigh Pain Descriptors / Indicators: Aching, Sore         Frequency  Min 2X/week        Progress Toward Goals  OT Goals(current goals can now be found in the care plan section)  Progress towards OT goals: Progressing toward goals  Acute Rehab OT Goals Patient Stated Goal: "I want to get back to my sister" OT Goal Formulation: Patient unable to participate in goal setting Time For Goal Achievement: 07/25/21 Potential to Achieve Goals: Fair ADL Goals Additional ADL Goal #3: Assess vision functionally  Plan Discharge plan remains appropriate       AM-PAC OT "6 Clicks" Daily Activity     Outcome Measure   Help from another person eating meals?: A  Little Help from another person taking care of personal grooming?: A Little Help from another person toileting, which includes using toliet, bedpan, or urinal?: A Little Help from another person bathing (including washing, rinsing, drying)?: A Little Help from another person to put on and taking off regular upper body clothing?: A Little Help from another person to put on and taking off regular lower body clothing?: A Little 6 Click Score: 18    End of Session Equipment Utilized During Treatment: Gait belt;Rolling walker (2 wheels)  OT Visit Diagnosis: Unsteadiness on feet (R26.81);Other abnormalities of gait and mobility (R26.89);Muscle weakness (generalized) (M62.81);Other symptoms and signs involving cognitive function   Activity Tolerance Patient tolerated treatment well   Patient Left in bed;with call bell/phone within reach;with bed alarm set   Nurse Communication Mobility status        Time: 4944-9675 OT Time Calculation (min): 15 min  Charges: OT General Charges $OT Visit: 1  Visit OT Treatments $Self Care/Home Management : 8-22 mins  Ignacia Palma, OTR/L [facility] [phone] Office [phone]    Evette Georges 07/05/2021, 11:23 AM

## 2021-07-05 NOTE — Progress Notes (Addendum)
Called by primary RN to help her with the patient. [name] is repeatedly attempting to get out of bed, is not redirectable with the telesitter and is currently stating "I want to go home! Why have you put me here?!" Patient is becoming belligerent to staff.   Telesitter called me and warned me concerning the patient not being a good candidate for telesitting. Patient needs an in-person sitter, but currently staffing is limited systemwide for any sitters.   Explained to Primary RN of possible other options for this patient to attempt to stay in bed such as distraction techniques, contacting an MD for a possible PRN or other means.   Will explore other options and attempt to calm down patient.   Danielle Rankin, Charge Nurse

## 2021-07-05 NOTE — Progress Notes (Signed)
Physical Therapy Treatment Patient Details Name: Crystal Rogers MRN: [id number] DOB: August 31, 1940 Today's Date: 07/05/2021   History of Present Illness 81yo woman who presented to ED on 6/1 with acute encephalopathy and expressive aphasia, with negative stroke workup, multiple electrolyte abnormalities, and left temporal cortical dysfunction on EEG. PMH: HTN, HLD, hypothyroidism, carotid artery stenosis, and recent pneumonia,    PT Comments    Notable change in functional and cognitive status from initial visit approx 24 hours ago. Pt now requiring significant cues and physical assist to facilitate mobility. Only tolerated short distance in room with frequent assist to navigate RW. Coughing frequently. RN and MD updated. Recs updated due to safety concerns, now appropriate for skilled care for rehab to assist with improving loss of function.    Recommendations for follow up therapy are one component of a multi-disciplinary discharge planning process, led by the attending physician.  Recommendations may be updated based on patient status, additional functional criteria and insurance authorization.  Follow Up Recommendations  Skilled nursing-short term rehab (<3 hours/day)     Assistance Recommended at Discharge Frequent or constant Supervision/Assistance  Patient can return home with the following A lot of help with walking and/or transfers;A lot of help with bathing/dressing/bathroom;Assistance with cooking/housework;Assistance with feeding;Direct supervision/assist for medications management;Direct supervision/assist for financial management;Help with stairs or ramp for entrance;Assist for transportation   Equipment Recommendations   (TBD next venue of care)    Recommendations for Other Services       Precautions / Restrictions Precautions Precautions: Fall Restrictions Weight Bearing Restrictions: No     Mobility  Bed Mobility Overal bed mobility: Needs Assistance Bed Mobility:  Supine to Sit, Sit to Supine     Supine to sit: Min assist Sit to supine: Min assist   General bed mobility comments: Min assist to facilitate movement into and out of bed. Pt reaching for blankets and unable to follow correct motor patterns without assist.    Transfers Overall transfer level: Needs assistance Equipment used: Rolling walker (2 wheels) Transfers: Sit to/from Stand Sit to Stand: Min assist           General transfer comment: Min assist for boost and balance. Holds RW upon standing to steady herself appropriately.    Ambulation/Gait Ambulation/Gait assistance: Min assist Gait Distance (Feet): 12 Feet Assistive device: Rolling walker (2 wheels) Gait Pattern/deviations: Step-through pattern, Decreased step length - right, Decreased step length - left, Shuffle, Trunk flexed Gait velocity: slow Gait velocity interpretation: <1.31 ft/sec, indicative of household ambulator   General Gait Details: Required constant Min assist for RW control and a little assist with balance, no buckling noted however. Pt unable to navigate around furniture, walked to sink and able to stand but would not release RW for functional task.   Stairs Stairs:  (Standing march)           Wheelchair Mobility    Modified Rankin (Stroke Patients Only)       Balance Overall balance assessment: Needs assistance Sitting-balance support: No upper extremity supported, Feet supported Sitting balance-Leahy Scale: Fair     Standing balance support: Single extremity supported, During functional activity Standing balance-Leahy Scale: Poor                              Cognition Arousal/Alertness: Awake/alert Behavior During Therapy: Restless, Anxious Overall Cognitive Status: Impaired/Different from baseline Area of Impairment: Orientation, Following commands, Safety/judgement, Problem solving  Orientation Level: Disoriented to, Person, Place, Time,  Situation     Following Commands: Follows one step commands inconsistently, Follows one step commands with increased time Safety/Judgement: Decreased awareness of safety, Decreased awareness of deficits   Problem Solving: Slow processing, Decreased initiation, Difficulty sequencing, Requires verbal cues, Requires tactile cues General Comments: Significant difference from evaluation yesterday. Pt now unoriented except will state yes if I state her full name. Very slow proccessing with required tactile and verbal cues to facilitate desired activity.        Exercises      General Comments General comments (skin integrity, edema, etc.): HR in 70s when returned to bed - alert that she had become tachy >145 on monitor during ambulatory bout.      Pertinent Vitals/Pain Pain Assessment Pain Assessment: No/denies pain (Answered "no" multiple times throughout therapy session when assessed for pain.)    Home Living                          Prior Function            PT Goals (current goals can now be found in the care plan section) Acute Rehab PT Goals Patient Stated Goal: none stated at this time PT Goal Formulation: With patient/family Time For Goal Achievement: 07/18/21 Potential to Achieve Goals: Fair Progress towards PT goals: Not progressing toward goals - comment (Set-back and change in status from last visit. Continue to assess.)    Frequency    Min 3X/week      PT Plan Discharge plan needs to be updated    Co-evaluation              AM-PAC PT "6 Clicks" Mobility   Outcome Measure  Help needed turning from your back to your side while in a flat bed without using bedrails?: A Little Help needed moving from lying on your back to sitting on the side of a flat bed without using bedrails?: A Little Help needed moving to and from a bed to a chair (including a wheelchair)?: A Little Help needed standing up from a chair using your arms (e.g., wheelchair or  bedside chair)?: A Little Help needed to walk in hospital room?: A Lot Help needed climbing 3-5 steps with a railing? : Total 6 Click Score: 15    End of Session Equipment Utilized During Treatment: Gait belt Activity Tolerance: Treatment limited secondary to agitation Patient left: in bed;with call bell/phone within reach;with bed alarm set Nurse Communication: Mobility status;Other (comment) (Change in functional and cognitive status from initial evaluation yesterday, POC recs updated.) PT Visit Diagnosis: Unsteadiness on feet (R26.81);Other abnormalities of gait and mobility (R26.89);Muscle weakness (generalized) (M62.81);Difficulty in walking, not elsewhere classified (R26.2)     Time: [id number] PT Time Calculation (min) (ACUTE ONLY): 20 min  Charges:  $Therapeutic Activity: 8-22 mins                     Candie Mile, PT    Ellouise Newer 07/05/2021, 11:34 AM

## 2021-07-05 NOTE — Progress Notes (Signed)
Patient moans loudly when in the room alone however she denies any pain, discomfort or any other issue. She is alert to person and place only. She does not know why she is in the hospital and is very agitated about being here and not being able to go home. Tele sitter in use. She is agitated and requires frequent reorientation. She is overall cooperative.

## 2021-07-05 NOTE — Progress Notes (Addendum)
Subjective:  Overnight Events: No acute events or concerns overnight.  The patient was seen and evaluated on rounds this morning. She states that she is feeling well but cannot recall why she is in the hospital or how long she has been in the hospital. She denies any pain.  Objective:  Vital signs in last 24 hours: Vitals:   07/04/21 0720 07/04/21 1115 07/04/21 1316 07/04/21 1515  BP: (!) 163/56 (!) 164/64 (!) 173/61 (!) 155/54  Pulse: 75 63 68 73  Resp: 16 20 20 20   Temp: 98.9 F (37.2 C) 98.5 F (36.9 C) 98.3 F (36.8 C) 98.5 F (36.9 C)  TempSrc: Oral Oral Oral Oral  SpO2: 99% 98% 99% 97%  Weight:      Height:       Weight change:   Intake/Output Summary (Last 24 hours) at 07/05/2021 0748 Last data filed at 07/04/2021 1830 Gross per 24 hour  Intake 300 ml  Output --  Net 300 ml     Physical Exam   Constitutional: elderly appearing, frail, in no acute distress HEENT: mildly dry mucous membranes Cardiovascular: regular rate with normal rhythm, no m/r/g Pulmonary/Chest: normal work of breathing on room air, lungs clear to auscultation bilaterally Abdominal: soft, non-tender, non-distended, bowel sounds present MSK: normal bulk and tone, no peripheral edema Skin: warm and dry. Neurological: alert and appropriately answering questions. Oriented to name only. She does not know why she is in the hospital.  Assessment/Plan:  Principal Problem:   Acute encephalopathy Active Problems:   Uncontrolled hypertension   Hypokalemia   Hypomagnesemia   Stage 3a chronic kidney disease (HCC)  Crystal Rogers is an 22 yoF with HTN, HLD, hypothyroidism, gout, GERD, carotid artery stenosis, asthma and recent pneumonia presenting with acute encephalopathy and expressive aphasia likely secondary to electrolyte abnormalities with hypokalemia, hypocalcemia, hypomagnesemia.    Acute encephalopathy Expressive aphasia Possible post-ictal state  Electrolyte Disturbances Patient with  acute encephalopathy possibly secondary to electrolyte abnormalities, polypharmacy, less likely postictal state from recent seizure. MRI negative for stroke. EEG negative for active seizure. On admission patient with hypokalemia to 2.7, low ionized calcium to 0.8, low magnesium to 1.4. Unremarkable UA, normal lactic acid, afebrile, no leukocytosis to suggest infection. Patient is on diazepam and meclizine at home that could be contributing as well. Neurology has evaluated the patient and performed overnight CTM EEG. They recommend Keppra 500 mg BID and outpatient neurology follow-up. PT consulted and states patent could be safe for discharge home and would benefit from home health PT. OT consulted and recommending short-term SNF prior to going home. They will reevaluate today. -Keppra 500 mg per neurology recommendation -Daily BMP, replete electrolytes as necessary -OT reevaluation today. -Hold diazepam, meclizine  Acute Kidney Injury Hyponatremia  Today on hospital day 3, the patient's creatinine is 1.12, elevated from 0.83 yesterday and 0.73 the day prior. The patient's sodium is down to 130 after previously being stable at 134 throughout her admission. Suspect hypovolemic hyponatremia given her AKI, mildly dry mucous membranes, and decreased fluid intake. Plan to give fluid bolus, encourage PO fluid intake and recheck BMP. -Recheck BMP -Encourage PO fluid intake -NS bolus 1L   Recent pneumonia  Bronchiectasis  Asthma Recently seen by outpatient pulmonology for productive cough and abnormal chest radiograph findings of a RLL opacity on 3/30 which persisted on repeat chest radiograph on 4/14 with worsening right apical opacity. CT chest on 5/25 showed progressive RML and RLL bronchiectasis with diffuse opacification of RLL, consistent  with mucoid impaction, aspiration or infection. Also noted to have diffuse reticulonodular opacities within right middle and lower lobes. She was  treated with  prednisone 20 mg daily for 5 days (last dose reportedly 5/28). She was also on clarithromycin from 4/18 to 5/31. The patient is not complaining of cough or shortness of breath at this time. - Continue combivent  - Maintain oxygen saturations >92% - Pulmonary hygiene      Uncontrolled HTN SBP >180 on admission. Currently 155/57. Patient is not complaining of any pain or shortness of breath but is still oriented to name only. Elevated blood pressures could be contributing to AMS but less likely without signs of end organ damage or pulmonary edema.  -Will resume home metoprolol succinate, losartan. Holding HCTZ given hypokalemia. -Continue Amlodipine 10 mg qd.   History of Meniere's Disease On diazepam 5 mg q8h and meclizine as needed for severe vertigo. Holding these now and would recommend discontinuing both as this could be a possible cause of patient's confusion.    HLD Carotid artery stenosis -Continue atorvastatin    Normocytic anemia Hemoglobin trended down slightly from 10.5 to 9.8 earlier in admission, now stable at 9.6. No signs of active bleeding. Patient had iron studies done at an outside hospital on 06/22/21 that showed low saturation % at 12. Patient's last colonoscopy was in 2015, and she was recommended for 10 year follow-up at that time. Will consider outpatient colonoscopy at discharge. Plan to trend CBC here. -Trend CBC   Hypothyroidism -Continue levothyroxine    Gout Recently had a gout flare and was on colchicine.  Diet: Regular VTE: Lovenox IVF: None Code: Full   Prior to Admission Living Arrangement: Home Anticipated Discharge Location: TBD Barriers to Discharge: OT reevaluation, possible SNF placement Dispo: Anticipated discharge in approximately 1-2 day(s).   Crystal Rogers, MS3 07/05/2021  Attestation for Student Documentation:  I personally was present and performed or re-performed the history, physical exam and medical decision-making activities of  this service and have verified that the service and findings are accurately documented in the student's note.  Crystal Rogers was alert but only oriented to self this morning.  I believe this is largely hospital delirium.  Her metabolic abnormality was corrected and remain within good range this morning.  No reported seizure activity overnight.  OT reevaluated patient this morning and felt that she is not safe to go home.  They recommended SNF.  We will place [facility] consult.  Gaylan Gerold, DO 07/05/2021, 10:21 AM

## 2021-07-06 LAB — CBC
HCT: 29.7 % — ABNORMAL LOW (ref 36.0–46.0)
Hemoglobin: 9.8 g/dL — ABNORMAL LOW (ref 12.0–15.0)
MCH: 28.7 pg (ref 26.0–34.0)
MCHC: 33 g/dL (ref 30.0–36.0)
MCV: 87.1 fL (ref 80.0–100.0)
Platelets: 140 10*3/uL — ABNORMAL LOW (ref 150–400)
RBC: 3.41 MIL/uL — ABNORMAL LOW (ref 3.87–5.11)
RDW: 14.1 % (ref 11.5–15.5)
WBC: 6.8 10*3/uL (ref 4.0–10.5)
nRBC: 0 % (ref 0.0–0.2)

## 2021-07-06 LAB — BASIC METABOLIC PANEL
Anion gap: 14 (ref 5–15)
BUN: 15 mg/dL (ref 8–23)
CO2: 22 mmol/L (ref 22–32)
Calcium: 9.5 mg/dL (ref 8.9–10.3)
Chloride: 97 mmol/L — ABNORMAL LOW (ref 98–111)
Creatinine, Ser: 1.13 mg/dL — ABNORMAL HIGH (ref 0.44–1.00)
GFR, Estimated: 49 mL/min — ABNORMAL LOW (ref >60)
Glucose, Bld: 83 mg/dL (ref 70–99)
Potassium: 4.4 mmol/L (ref 3.5–5.1)
Sodium: 133 mmol/L — ABNORMAL LOW (ref 135–145)

## 2021-07-06 LAB — MAGNESIUM: Magnesium: 1.3 mg/dL — ABNORMAL LOW (ref 1.7–2.4)

## 2021-07-06 MED ORDER — MAGNESIUM SULFATE 4 GM/100ML IV SOLN
4.0000 g | Freq: Once | INTRAVENOUS | Status: AC
  Administered 2021-07-06: 4 g via INTRAVENOUS
  Filled 2021-07-06: qty 100

## 2021-07-06 NOTE — NC FL2 (Signed)
[place] LEVEL OF CARE SCREENING TOOL     IDENTIFICATION  Patient Name: Crystal Rogers Birthdate: 1940-07-23 Sex: female Admission Date (Current Location): 07/02/2021  [facility] and [place] Number:  Herbalist and Address:  [facility] [place]. [facility], [place] [address] [place], [place] [address]      Provider Number: [id number]  Attending Physician Name and Address:  Angelica Pou, MD  Relative Name and Phone Number:       Current Level of Care: Hospital Recommended Level of Care: [place] Prior Approval Number:    Date Approved/Denied:   PASRR Number: [id number]  Discharge Plan: SNF    Current Diagnoses: Patient Active Problem List   Diagnosis Date Noted   Acute encephalopathy 07/02/2021   Uncontrolled hypertension 07/02/2021   Hypokalemia 07/02/2021   Hypomagnesemia 07/02/2021   Bronchiectasis ([place]) 07/02/2021   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   Metatarsalgia of right foot 09/17/2016   Vitamin D deficiency 05/27/2015   Stage 3a chronic kidney disease ([place]) 02/25/2015   Gastroesophageal reflux disease without esophagitis 02/25/2015   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   Hyperlipidemia LDL goal <70 02/25/2015   Hypothyroidism 02/25/2015   Meniere's disease 02/25/2015   Mild intermittent asthma without complication [id number]    Orientation RESPIRATION BLADDER Height & Weight     Self, Place  Normal Incontinent, Continent (incontinent at times) Weight: 123 lb 0.3 oz (55.8 kg) Height:  5\' 4"  (162.6 cm)  BEHAVIORAL SYMPTOMS/MOOD NEUROLOGICAL BOWEL NUTRITION STATUS    Convulsions/Seizures Continent Diet (regular)  AMBULATORY STATUS COMMUNICATION OF NEEDS Skin   Limited Assist Verbally Normal                       Personal Care Assistance Level of Assistance  Bathing, Feeding, Dressing Bathing Assistance: Limited assistance Feeding assistance: Limited assistance Dressing Assistance: Limited  assistance     Functional Limitations Info  Sight, Hearing Sight Info: Impaired Hearing Info: Impaired      SPECIAL CARE FACTORS FREQUENCY  PT (By licensed PT), OT (By licensed OT)     PT Frequency: 5x/wk OT Frequency: 5x/wk            Contractures Contractures Info: Not present    Additional Factors Info  Code Status, Allergies Code Status Info: Full Allergies Info: Allopurinol, Augmentin (Amoxicillin-pot Clavulanate), Doxycycline, Levaquin (Levofloxacin), Zithromax (Azithromycin)           Current Medications (07/06/2021):  This is the current hospital active medication list Current Facility-Administered Medications  Medication Dose Route Frequency Provider Last Rate Last Admin   acetaminophen (TYLENOL) tablet 650 mg  650 mg Oral Q6H PRN Rehman, Areeg N, DO   650 mg at 07/03/21 0334   albuterol (PROVENTIL) (2.5 MG/3ML) 0.083% nebulizer solution 3 mL  3 mL Inhalation Q6H PRN Rehman, Areeg N, DO       amLODipine (NORVASC) tablet 10 mg  10 mg Oral Daily Gaylan Gerold, DO   10 mg at 07/06/21 1018   aspirin EC tablet 81 mg  81 mg Oral Daily Rehman, Areeg N, DO   81 mg at 07/06/21 1017   cholecalciferol (VITAMIN D3) tablet 2,000 Units  2,000 Units Oral Daily Velna Ochs, MD   2,000 Units at 07/06/21 1018   enoxaparin (LOVENOX) injection 40 mg  40 mg Subcutaneous Q24H Rehman, Areeg N, DO   40 mg at 07/05/21 1955   guaiFENesin-dextromethorphan (ROBITUSSIN DM) 100-10 MG/5ML syrup 5 mL  5 mL Oral  Q4H PRN Axel Filler, MD   5 mL at 07/05/21 0323   ipratropium-albuterol (DUONEB) 0.5-2.5 (3) MG/3ML nebulizer solution 3 mL  3 mL Nebulization Q6H PRN Axel Filler, MD       levETIRAcetam (KEPPRA) tablet 500 mg  500 mg Oral BID Rehman, Areeg N, DO   500 mg at 07/06/21 1017   levothyroxine (SYNTHROID) tablet 100 mcg  100 mcg Oral q morning Rehman, Areeg N, DO   100 mcg at 07/06/21 0436   losartan (COZAAR) tablet 100 mg  100 mg Oral Daily Rehman, Areeg N, DO   100 mg  at 07/06/21 1017   magnesium sulfate IVPB 4 g 100 mL  4 g Intravenous Once France Ravens, MD 50 mL/hr at 07/06/21 1025 4 g at 07/06/21 1025   melatonin tablet 3 mg  3 mg Oral QHS Demaio, Alexa, MD   3 mg at 07/05/21 2137   metoprolol succinate (TOPROL-XL) 24 hr tablet 50 mg  50 mg Oral Daily [name], MD   50 mg at 07/06/21 1017   montelukast (SINGULAIR) tablet 10 mg  10 mg Oral Daily Rehman, Areeg N, DO   10 mg at 07/06/21 1018   pantoprazole (PROTONIX) EC tablet 40 mg  40 mg Oral Daily Rehman, Areeg N, DO   40 mg at 07/06/21 1018     Discharge Medications: Please see discharge summary for a list of discharge medications.  Relevant Imaging Results:  Relevant Lab Results:   Additional Information SS#: [id number]  Geralynn Ochs, LCSW

## 2021-07-06 NOTE — TOC Initial Note (Addendum)
Transition of Care [facility]) - Initial/Assessment Note    Patient Details  Name: Crystal Rogers MRN: [id number] Date of Birth: [birth year]  [facility]) CM/SW Contact:    Geralynn Ochs, LCSW Phone Number: 07/06/2021, 10:47 AM  Clinical Narrative:             CSW spoke with patient's sister, [name], to discuss recommendation for SNF. Sister asked about doing rehab at home, but CSW read out how patient did on last PT session, and sister in agreement that she can't provide that level of assist for the patient. CSW discussed SNF options close to home, and sister in agreement. CSW to send out referral and will follow up with bed offers.     UPDATE 4:28 PM: CSW spoke with niece, Jackelyn Poling, to provide bed offers. Niece in agreement with [facility]. CSW spoke with Admissions at [facility] to confirm bed availability, and requested insurance authorization. CSW to follow.  Expected Discharge Plan: Skilled Nursing Facility Barriers to Discharge: Continued Medical Work up, Ship broker   Patient Goals and CMS Choice Patient states their goals for this hospitalization and ongoing recovery are:: Patient unable to participate in goal setting, not fully oriented CMS Medicare.gov Compare Post Acute Care list provided to:: Patient Represenative (must comment) Choice offered to / list presented to : Sibling  Expected Discharge Plan and Services Expected Discharge Plan: [place] Choice: [place] arrangements for the past 2 months: Single Family Home                                      Prior Living Arrangements/Services Living arrangements for the past 2 months: Single Family Home Lives with:: Siblings Patient language and need for interpreter reviewed:: No Do you feel safe going back to the place where you live?: Yes      Need for Family Participation in Patient Care: Yes (Comment) Care giver support system in  place?: No (comment)   Criminal Activity/Legal Involvement Pertinent to Current Situation/Hospitalization: No - Comment as needed  Activities of Daily Living      Permission Sought/Granted Permission sought to share information with : Facility Sport and exercise psychologist, Family Supports Permission granted to share information with : Yes, Verbal Permission Granted  Share Information with NAME: [name]  Permission granted to share info w AGENCY: SNF  Permission granted to share info w Relationship: Sister     Emotional Assessment   Attitude/Demeanor/Rapport: Unable to Assess Affect (typically observed): Unable to Assess Orientation: : Oriented to Self, Oriented to Place Alcohol / Substance Use: Not Applicable Psych Involvement: No (comment)  Admission diagnosis:  Hypocalcemia [E83.51] Hypokalemia [E87.6] Hypomagnesemia [E83.42] Acute encephalopathy [G93.40] Altered mental status, unspecified altered mental status type [R41.82] Patient Active Problem List   Diagnosis Date Noted   Acute encephalopathy 07/02/2021   Uncontrolled hypertension 07/02/2021   Hypokalemia 07/02/2021   Hypomagnesemia 07/02/2021   Bronchiectasis ([place]) 07/02/2021   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   Metatarsalgia of right foot 09/17/2016   Vitamin D deficiency 05/27/2015   Stage 3a chronic kidney disease ([place]) 02/25/2015   Gastroesophageal reflux disease without esophagitis 02/25/2015   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

## 2021-07-06 NOTE — Progress Notes (Signed)
Occupational Therapy Treatment Patient Details Name: Crystal Rogers MRN: [id number] DOB: 11-22-1940 Today's Date: 07/06/2021   History of present illness 80yo woman who presented to ED on 6/1 with acute encephalopathy and expressive aphasia, with negative stroke workup, multiple electrolyte abnormalities, and left temporal cortical dysfunction on EEG. PMH: HTN, HLD, hypothyroidism, carotid artery stenosis, and recent pneumonia,   OT comments  Pt continues to present with decreased cognition including poor orientation, safety, problem solving, and awareness. Pt requiring Max cues throughout all ADLs. Pt performing grooming at sink with Min A for balance and Max cues for cognition. Pt performing functional mobility with Min A (with and without RW). Continue to recommend dc to SNF and will continue to follow acutely as admitted.    Recommendations for follow up therapy are one component of a multi-disciplinary discharge planning process, led by the attending physician.  Recommendations may be updated based on patient status, additional functional criteria and insurance authorization.    Follow Up Recommendations  Skilled nursing-short term rehab (<3 hours/day)    Assistance Recommended at Discharge Frequent or constant Supervision/Assistance  Patient can return home with the following  A little help with walking and/or transfers;A little help with bathing/dressing/bathroom;Assistance with feeding;Help with stairs or ramp for entrance;Assist for transportation;Assistance with cooking/housework;Direct supervision/assist for financial management;Direct supervision/assist for medications management   Equipment Recommendations  Other (comment)    Recommendations for Other Services      Precautions / Restrictions Precautions Precautions: Fall Restrictions Weight Bearing Restrictions: No       Mobility Bed Mobility Overal bed mobility: Needs Assistance Bed Mobility: Supine to Sit, Sit to  Supine     Supine to sit: Min guard Sit to supine: Min guard   General bed mobility comments: incr time and effort; cues to keep pt on task and moving    Transfers Overall transfer level: Needs assistance Equipment used: Rolling walker (2 wheels) Transfers: Sit to/from Stand Sit to Stand: Min guard           General transfer comment: max cues for safe use of RW     Balance Overall balance assessment: Needs assistance Sitting-balance support: No upper extremity supported, Feet supported Sitting balance-Leahy Scale: Fair     Standing balance support: Single extremity supported, During functional activity Standing balance-Leahy Scale: Poor                             ADL either performed or assessed with clinical judgement   ADL Overall ADL's : Needs assistance/impaired     Grooming: Brushing hair;Standing;Minimal assistance Grooming Details (indicate cue type and reason): Min A for standing balance. Pt requiring signfiicant time due to processing speed. Pt perseverating on only brushing the R front section of her hair. Requiring cues to terminate.                             Functional mobility during ADLs: Minimal assistance (with and without walker) General ADL Comments: Pt presenting with decreased orientation, awareness, memory, and balance. Pt requiring Max cues and Min A throughotu all ADLs.    Extremity/Trunk Assessment Upper Extremity Assessment Upper Extremity Assessment: Generalized weakness   Lower Extremity Assessment Lower Extremity Assessment: Defer to PT evaluation        Vision       Perception     Praxis      Cognition Arousal/Alertness: Awake/alert Behavior During Therapy: Flat  affect Overall Cognitive Status: Impaired/Different from baseline Area of Impairment: Orientation, Following commands, Safety/judgement, Problem solving, Attention, Awareness                 Orientation Level: Disoriented to,  Place, Time, Situation Current Attention Level: Focused, Sustained   Following Commands: Follows one step commands inconsistently, Follows one step commands with increased time Safety/Judgement: Decreased awareness of safety, Decreased awareness of deficits Awareness:  (pre-intellectual) Problem Solving: Slow processing, Decreased initiation, Difficulty sequencing, Requires verbal cues, Requires tactile cues General Comments: difficulty folllowing pure verbal commands; needs multi-modal cues and repetition; perseverates on tasks or loses track of what she was asked to do and requires repetition        Exercises      Shoulder Instructions       General Comments      Pertinent Vitals/ Pain       Pain Assessment Pain Assessment: No/denies pain  Home Living                                          Prior Functioning/Environment              Frequency  Min 2X/week        Progress Toward Goals  OT Goals(current goals can now be found in the care plan section)  Progress towards OT goals: Progressing toward goals  Acute Rehab OT Goals OT Goal Formulation: Patient unable to participate in goal setting Time For Goal Achievement: 07/25/21 Potential to Achieve Goals: Fair ADL Goals Pt Will Perform Grooming: Independently;standing Pt Will Perform Upper Body Bathing: Independently;sitting Pt Will Perform Lower Body Bathing: with modified independence;sit to/from stand Pt Will Perform Upper Body Dressing: Independently;sitting Pt Will Perform Lower Body Dressing: with modified independence;sit to/from stand Pt Will Transfer to Toilet: with modified independence;ambulating;bedside commode Additional ADL Goal #1: Pt will be Independent in and OOB for basic ADLs Additional ADL Goal #2: Pt will be alert and oriented x4 without cues Additional ADL Goal #3: Assess vision functionally  Plan Discharge plan remains appropriate    Co-evaluation    PT/OT/SLP  Co-Evaluation/Treatment: Yes Reason for Co-Treatment: Other (comment) (to address cognition and discharge planning)   OT goals addressed during session: ADL's and self-care      AM-PAC OT "6 Clicks" Daily Activity     Outcome Measure   Help from another person eating meals?: A Little Help from another person taking care of personal grooming?: A Little Help from another person toileting, which includes using toliet, bedpan, or urinal?: A Little Help from another person bathing (including washing, rinsing, drying)?: A Little Help from another person to put on and taking off regular upper body clothing?: A Little Help from another person to put on and taking off regular lower body clothing?: A Little 6 Click Score: 18    End of Session Equipment Utilized During Treatment: Gait belt;Rolling walker (2 wheels)  OT Visit Diagnosis: Unsteadiness on feet (R26.81);Other abnormalities of gait and mobility (R26.89);Muscle weakness (generalized) (M62.81);Other symptoms and signs involving cognitive function   Activity Tolerance Patient tolerated treatment well   Patient Left in bed;with call bell/phone within reach;with bed alarm set   Nurse Communication Mobility status        Time: [id number] OT Time Calculation (min): 26 min  Charges: OT General Charges $OT Visit: 1 Visit OT Treatments $Self Care/Home Management : 8-22 mins  Victoriah Wilds MSOT, OTR/L Acute Rehab Pager: [phone] Office: [phone]  Theodoro Grist Vasilisa Vore 07/06/2021, 2:02 PM

## 2021-07-06 NOTE — Progress Notes (Signed)
Physical Therapy Treatment Patient Details Name: Crystal Rogers MRN: [id number] DOB: 12/27/1940 Today's Date: 07/06/2021   History of Present Illness 81yo woman who presented to ED on 6/1 with acute encephalopathy and expressive aphasia, with negative stroke workup, multiple electrolyte abnormalities, and left temporal cortical dysfunction on EEG. PMH: HTN, HLD, hypothyroidism, carotid artery stenosis, and recent pneumonia,    PT Comments    Patient remains confused (oriented only to self) and requires incr time and cues for all mobility tasks. She easily loses track of what she is supposed to be doing and/or gets easily distracted. She is unable to effectively, safely use rolling walker and without the RW she takes very short, shuffling steps and required min assist. She is not yet appropriate to return home where she was completely independent, including driving and shopping.    Recommendations for follow up therapy are one component of a multi-disciplinary discharge planning process, led by the attending physician.  Recommendations may be updated based on patient status, additional functional criteria and insurance authorization.  Follow Up Recommendations  Skilled nursing-short term rehab (<3 hours/day)     Assistance Recommended at Discharge Frequent or constant Supervision/Assistance  Patient can return home with the following A lot of help with walking and/or transfers;A lot of help with bathing/dressing/bathroom;Assistance with cooking/housework;Assistance with feeding;Direct supervision/assist for medications management;Direct supervision/assist for financial management;Help with stairs or ramp for entrance;Assist for transportation   Equipment Recommendations   (TBD next venue of care)    Recommendations for Other Services       Precautions / Restrictions Precautions Precautions: Fall Restrictions Weight Bearing Restrictions: No     Mobility  Bed Mobility Overal bed  mobility: Needs Assistance Bed Mobility: Supine to Sit, Sit to Supine     Supine to sit: Min guard Sit to supine: Min guard   General bed mobility comments: incr time and effort; cues to keep pt on task and moving    Transfers Overall transfer level: Needs assistance Equipment used: Rolling walker (2 wheels) Transfers: Sit to/from Stand Sit to Stand: Min guard           General transfer comment: max cues for safe use of RW    Ambulation/Gait Ambulation/Gait assistance: Min assist Gait Distance (Feet): 75 Feet (15 ft RW; 60 ft HHA) Assistive device: Rolling walker (2 wheels), 1 person hand held assist Gait Pattern/deviations: Step-through pattern, Decreased step length - right, Decreased step length - left, Shuffle, Trunk flexed Gait velocity: slow     General Gait Details: Required constant Min assist for RW control and a little assist with balance, no buckling noted however. Pt unable to navigate around furniture, walked to sink and able to stand but would not release RW for functional task.   Stairs             Wheelchair Mobility    Modified Rankin (Stroke Patients Only)       Balance Overall balance assessment: Needs assistance Sitting-balance support: No upper extremity supported, Feet supported Sitting balance-Leahy Scale: Fair     Standing balance support: Single extremity supported, During functional activity Standing balance-Leahy Scale: Poor                              Cognition Arousal/Alertness: Awake/alert Behavior During Therapy: Flat affect Overall Cognitive Status: Impaired/Different from baseline Area of Impairment: Orientation, Following commands, Safety/judgement, Problem solving, Attention, Awareness  Orientation Level: Disoriented to, Place, Time, Situation Current Attention Level: Focused, Sustained   Following Commands: Follows one step commands inconsistently, Follows one step commands with  increased time Safety/Judgement: Decreased awareness of safety, Decreased awareness of deficits Awareness:  (pre-intellectual) Problem Solving: Slow processing, Decreased initiation, Difficulty sequencing, Requires verbal cues, Requires tactile cues General Comments: difficulty folllowing pure verbal commands; needs multi-modal cues and repetition; perseverates on tasks or loses track of what she was asked to do and requires repetition        Exercises      General Comments        Pertinent Vitals/Pain Pain Assessment Pain Assessment: No/denies pain    Home Living                          Prior Function            PT Goals (current goals can now be found in the care plan section) Acute Rehab PT Goals Patient Stated Goal: none stated at this time Time For Goal Achievement: 07/18/21 Potential to Achieve Goals: Fair Progress towards PT goals: Not progressing toward goals - comment (decr cognition since evaluation)    Frequency    Min 2X/week      PT Plan Current plan remains appropriate    Co-evaluation PT/OT/SLP Co-Evaluation/Treatment: Yes Reason for Co-Treatment: Other (comment);To address functional/ADL transfers (to address cognition and discharge planning)          AM-PAC PT "6 Clicks" Mobility   Outcome Measure  Help needed turning from your back to your side while in a flat bed without using bedrails?: A Little Help needed moving from lying on your back to sitting on the side of a flat bed without using bedrails?: A Little Help needed moving to and from a bed to a chair (including a wheelchair)?: A Little Help needed standing up from a chair using your arms (e.g., wheelchair or bedside chair)?: A Little Help needed to walk in hospital room?: A Lot Help needed climbing 3-5 steps with a railing? : Total 6 Click Score: 15    End of Session Equipment Utilized During Treatment: Gait belt Activity Tolerance: Patient tolerated treatment  well Patient left: in bed;with call bell/phone within reach;with bed alarm set Nurse Communication: Mobility status PT Visit Diagnosis: Unsteadiness on feet (R26.81);Other abnormalities of gait and mobility (R26.89);Muscle weakness (generalized) (M62.81);Difficulty in walking, not elsewhere classified (R26.2)     Time: 6378-5885 PT Time Calculation (min) (ACUTE ONLY): 25 min  Charges:  $Gait Training: 8-22 mins                      Jerolyn Center, PT Acute Rehabilitation Services  Office [phone]    Zena Amos 07/06/2021, 11:39 AM

## 2021-07-06 NOTE — Progress Notes (Addendum)
Subjective:  Overnight Events: No acute events or concerns overnight.  The patient was seen and evaluated on rounds this morning. She states that she is feeling well but cannot recall why she is in the hospital or how long she has been in the hospital. She denies any pain. She states that she wants to go home and is not agreeable to PT's recommendation of SNF. Attempted to obtain more information from the sister regarding the possibility and safety of the patient returning home to live with her sister, but the sister did not answer. Social worker contacted the sister who states that she does not feel that she will be able to take care of the patient at their home.   Objective:  Vital signs in last 24 hours: Vitals:   07/06/21 0012 07/06/21 0419 07/06/21 0855 07/06/21 1210  BP: (!) 154/50 (!) 147/67 (!) 156/62 (!) 118/48  Pulse: 78 93 85 71  Resp: 18 19 20 18   Temp: (!) 97.5 F (36.4 C) 97.7 F (36.5 C) 98.1 F (36.7 C) 97.7 F (36.5 C)  TempSrc: Oral Oral Oral Oral  SpO2: 97% 99% 97% 98%  Weight:      Height:       Weight change:   Intake/Output Summary (Last 24 hours) at 07/06/2021 1233 Last data filed at 07/06/2021 1100 Gross per 24 hour  Intake 478 ml  Output 900 ml  Net -422 ml    Physical Exam   Constitutional: elderly appearing, frail, in no acute distress HEENT: moist mucous membranes Cardiovascular: regular rate with normal rhythm, no m/r/g Pulmonary/Chest: normal work of breathing on room air, lungs clear to auscultation bilaterally Abdominal: soft, non-tender, non-distended, bowel sounds present MSK: normal bulk and tone, no peripheral edema Skin: warm and dry. Neurological: alert and appropriately answering questions. Oriented to name only. She does not know why she is in the hospital.  Assessment/Plan:  Principal Problem:   Acute encephalopathy Active Problems:   Uncontrolled hypertension   Hypokalemia   Hypomagnesemia   Stage 3a chronic kidney disease  (HCC)  Crystal Rogers is an 89 yoF with HTN, HLD, hypothyroidism, gout, GERD, carotid artery stenosis, asthma and recent pneumonia presenting with acute encephalopathy and expressive aphasia likely secondary to electrolyte abnormalities with hypokalemia, hypocalcemia, hypomagnesemia. She now appears to have some delirium likely related to her being in the hospital.   Acute encephalopathy, resolved Expressive aphasia Possible seizure / post-ictal state  Electrolyte Disturbances Hospital Delirium Magnesium slightly low today at 1.3. Plan to replete. This morning, patient states that she wants to go home and is not agreeable to PT's recommendation of SNF. Attempted to obtain more information from the sister regarding the possibility and safety of the patient returning home to live with her sister, but the sister did not answer. Social worker was able to contact the sister who states that she does not feel that she will be able to care for the patient at home. Patient is medically stable for discharge to SNF at this time.  -Keppra 500 mg BID per neurology recommendation. EEG was abnormal with left temporal slowing but no epileptiform activity. Outpatient follow for further discussion whether to continue this long term.  -Replete magnesium -Daily BMP, replete electrolytes as necessary -Hold diazepam, meclizine -Delirium precautions -Pending SNF placement  Acute Kidney Injury Hyponatremia  Patient's creatinine today is 1.13. Yesterday on hospital day 3, the patient's creatinine was 1.12, elevated from 0.83 the day prior. Yesterday the patient's sodium was down to 130  after previously being stable at 134 throughout her admission. This improved to 133 after 1L bolus. Suspect hypovolemic hyponatremia given her AKI, mildly dry mucous membranes, and decreased fluid intake. Plan to encourage PO fluid intake and recheck BMP. -Recheck BMP -Encourage PO fluid intake   Recent pneumonia  Bronchiectasis   Asthma Recently followed by outpatient pulmonology for recent pneumonia and bronchiectasis. The patient is not complaining of cough or shortness of breath at this time. - Continue combivent prn - Maintain oxygen saturations >92% - Pulmonary hygiene      Uncontrolled HTN SBP >180 on admission. Currently 147/67. Patient is not complaining of any pain or shortness of breath but is still oriented to name only. Elevated blood pressures could be contributing to AMS but less likely without signs of end organ damage or pulmonary edema.  -Will resume home metoprolol succinate, losartan. Holding HCTZ given hypokalemia. -Continue Amlodipine 10 mg qd.   History of Meniere's Disease On diazepam 5 mg q8h and meclizine as needed for severe vertigo. Holding these now and would recommend discontinuing both as this could be a possible cause of patient's confusion.    HLD Carotid artery stenosis -Continue atorvastatin    Normocytic anemia Hemoglobin trended down slightly from 10.5 to 9.8 earlier in admission, now stable at 9.8. No signs of active bleeding. Patient had iron studies done at an outside hospital on 06/22/21 that showed low saturation % at 12. Patient's last colonoscopy was in 2015, and she was recommended for 10 year follow-up at that time. Will consider outpatient colonoscopy at discharge. Plan to trend CBC here. -Trend CBC   Hypothyroidism -Continue levothyroxine    Gout Recently had a gout flare and was on colchicine.  Diet: Regular VTE: Lovenox IVF: None Code: Full   Prior to Admission Living Arrangement: Home Anticipated Discharge Location: TBD Barriers to Discharge: SNF placement Dispo: Anticipated discharge in approximately 1-2 day(s).   Crystal Rogers, MS3 07/05/2021

## 2021-07-06 NOTE — Progress Notes (Signed)
Staff has had frequent contact with the patient all night due to her attempting to "go home" or because she has removed her telemetry. Patient has not slept at all tonight.

## 2021-07-06 NOTE — Care Management Important Message (Signed)
Important Message  Patient Details  Name: Crystal Rogers MRN: [id number] Date of Birth: March 17, 1940   Medicare Important Message Given:  Yes     [name] Montine Circle 07/06/2021, 3:36 PM

## 2021-07-07 LAB — CULTURE, BLOOD (ROUTINE X 2)
Culture: NO GROWTH
Culture: NO GROWTH
Special Requests: ADEQUATE
Special Requests: ADEQUATE

## 2021-07-07 LAB — CBC
HCT: 28.9 % — ABNORMAL LOW (ref 36.0–46.0)
Hemoglobin: 10 g/dL — ABNORMAL LOW (ref 12.0–15.0)
MCH: 29.7 pg (ref 26.0–34.0)
MCHC: 34.6 g/dL (ref 30.0–36.0)
MCV: 85.8 fL (ref 80.0–100.0)
Platelets: 166 10*3/uL (ref 150–400)
RBC: 3.37 MIL/uL — ABNORMAL LOW (ref 3.87–5.11)
RDW: 14 % (ref 11.5–15.5)
WBC: 5.8 10*3/uL (ref 4.0–10.5)
nRBC: 0 % (ref 0.0–0.2)

## 2021-07-07 LAB — BASIC METABOLIC PANEL
Anion gap: 9 (ref 5–15)
BUN: 23 mg/dL (ref 8–23)
CO2: 25 mmol/L (ref 22–32)
Calcium: 9 mg/dL (ref 8.9–10.3)
Chloride: 97 mmol/L — ABNORMAL LOW (ref 98–111)
Creatinine, Ser: 1.23 mg/dL — ABNORMAL HIGH (ref 0.44–1.00)
GFR, Estimated: 44 mL/min — ABNORMAL LOW (ref >60)
Glucose, Bld: 107 mg/dL — ABNORMAL HIGH (ref 70–99)
Potassium: 4.5 mmol/L (ref 3.5–5.1)
Sodium: 131 mmol/L — ABNORMAL LOW (ref 135–145)

## 2021-07-07 LAB — MAGNESIUM: Magnesium: 2.2 mg/dL (ref 1.7–2.4)

## 2021-07-07 MED ORDER — AMLODIPINE BESYLATE 10 MG PO TABS
10.0000 mg | ORAL_TABLET | Freq: Every day | ORAL | Status: DC
  Administered 2021-07-08: 10 mg via ORAL
  Filled 2021-07-07: qty 1

## 2021-07-07 MED ORDER — LACTATED RINGERS IV BOLUS
1000.0000 mL | Freq: Once | INTRAVENOUS | Status: AC
  Administered 2021-07-07: 1000 mL via INTRAVENOUS

## 2021-07-07 MED ORDER — HYDROXYZINE HCL 50 MG/ML IM SOLN
25.0000 mg | Freq: Three times a day (TID) | INTRAMUSCULAR | Status: DC | PRN
  Filled 2021-07-07: qty 0.5

## 2021-07-07 NOTE — Discharge Summary (Signed)
Name: Crystal Rogers MRN: [id number] DOB: 11-10-1940 81 y.o. PCP: Pcp, No  Date of Admission: 07/02/2021  8:06 AM Date of Discharge: 07/08/21 Attending Physician: Velna Ochs, MD  Discharge Diagnosis: 1. Acute encephalopathy, Hospital Delirium 2. Acute Kidney Injury, Hyponatremia 3. Recent pneumonia, bronchiectasis 4. Hypertension 5. History of Meniere's Disease 6. Hyperlipidemia 7. Carotid artery stenosis 8. Hypothyroidism 9. Gout 10. Prolonged QTc  Discharge Medications: Allergies as of 07/08/2021       Reactions   Allopurinol Hives   Augmentin [amoxicillin-pot Clavulanate] Nausea And Vomiting   Doxycycline Hives   Levaquin [levofloxacin] Other (See Comments)   insomnia   Zithromax [azithromycin] Diarrhea        Medication List     STOP taking these medications    azelastine 0.1 % nasal spray Commonly known as: ASTELIN   clarithromycin 500 MG tablet Commonly known as: BIAXIN   colchicine 0.6 MG tablet   diazepam 5 MG tablet Commonly known as: VALIUM   fexofenadine 180 MG tablet Commonly known as: ALLEGRA   hydrochlorothiazide 12.5 MG capsule Commonly known as: MICROZIDE   losartan 100 MG tablet Commonly known as: COZAAR   meclizine 25 MG tablet Commonly known as: ANTIVERT       TAKE these medications    albuterol 108 (90 Base) MCG/ACT inhaler Commonly known as: VENTOLIN HFA Inhale 1-2 puffs into the lungs every 6 (six) hours as needed for wheezing.   amLODipine 10 MG tablet Commonly known as: NORVASC Take 1 tablet (10 mg total) by mouth daily. Start taking on: July 09, 2021   aspirin EC 81 MG tablet Take 1 tablet by mouth daily.   Cholecalciferol 50 MCG (2000 UT) Caps Take 1 capsule by mouth daily.   Coenzyme Q10 200 MG capsule Take 200 mg by mouth daily.   Ipratropium-Albuterol 20-100 MCG/ACT Aers respimat Commonly known as: COMBIVENT Inhale 1 puff into the lungs every 6 (six) hours as needed.   levETIRAcetam 500 MG  tablet Commonly known as: KEPPRA Take 1 tablet (500 mg total) by mouth 2 (two) times daily.   levothyroxine 100 MCG tablet Commonly known as: SYNTHROID Take 100 mcg by mouth every morning.   melatonin 3 MG Tabs tablet Take 1 tablet (3 mg total) by mouth at bedtime.   metoprolol succinate 50 MG 24 hr tablet Commonly known as: TOPROL-XL Take 1 tablet (50 mg total) by mouth daily. Take with or immediately following a meal. What changed:  medication strength how much to take additional instructions   montelukast 10 MG tablet Commonly known as: SINGULAIR Take 1 tablet by mouth daily.   omeprazole 40 MG capsule Commonly known as: PRILOSEC Take 40 mg by mouth 2 (two) times daily.   Polyethyl Glycol-Propyl Glycol 0.4-0.3 % Soln Apply 1 drop to eye daily.        Disposition and follow-up:   Ms.Crystal Rogers was discharged from [facility] in Stable condition.  At the hospital follow up visit please address:  1.  PO intake, electrolyte abnormalities, goals of care, adherence to medication changes, neurology follow-up  2.  Labs / imaging needed at time of follow-up: BMP, ECG for QTc  3.  Pending labs/ test needing follow-up: None  Follow-up Appointments:   Hospital Course by problem list: 1. Acute encephalopathy, Hospital Delirium Patient presented to the ED with confusion and expressive aphasia likely secondary to electrolyte abnormalities. Patient had negative CT head and MRI. CTA head and neck showed persistent carotid artery stenosis. EEG showed focal  slowing with no current seizure activity. Patient was afebrile with no leukocytosis, unremarkable UA, normal lactate. 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 Patient presented hypokalemic at 2.7, hypocalcemic with ionized calcium 0.8, hypomagnesemic to 1.4. Other possible cause of her encephalopathy is polypharmacy as she take diazepam and meclizine at home. Neurology  evaluated the patient and thought that she may have had a previous seizure given slowing on EEG. They loaded the patient with Keppra, and she has remained on Keppra 500 mg BID throughout admission. They recommend discharging her on this, and they will see her has an outpatient to determine whether Keppra is indicated long term. The patient's electrolytes were repleted, though she did not show signs of significant improvement. Her magnesium did drop again during admission likely secondary to decreased PO intake. This normalized after repletion. Suspect that her continued confusion and disorientation is secondary to hospital delirium. Given her confusion and disorientation, PT/OT recommended SNF rehab prior to going home. Patient initially refused this, tearfully stating that she wanted to go home with her sister. After speaking with her sister and her niece, they stated that they would not feel comfortable caring for the patient at home. Care team here found her bed placement at [facility] in [place] near her family. Patient became agreeable to this after talking to her sister and niece and learning that they would be able to visit her there.  2. Acute Kidney Injury, Hyponatremia On admission, patient's creatinine was 0.86, and her GFR was >60. Over the course of her admission, creatinine increased to 1.23, and her GFR decreased to 44. Additionally, her sodium decreased from 134 on admission to 131 over the course of her hospitalization. Suspect that these are secondary to hypovolemia with poor PO intake while she has been in the hospital. She received a liter of normal saline that briefly corrected her sodium, and she received another liter on the morning of her discharge. Plan to encourage PO intake at her SNF and recheck BMP at PCP follow-up.   3. Recent pneumonia, bronchiectasis Patient recently seen by outpatient pulmonology for productive cough and abnormal chest radiograph findings of a RLL opacity on  3/30 which persisted on repeat chest radiograph on 4/14 with worsening right apical opacity. CT chest on 5/25 showed progressive RML and RLL bronchiectasis with diffuse opacification of RLL, consistent with mucoid impaction, aspiration or infection. Also noted to have diffuse reticulonodular opacities within right middle and lower lobes. She was  treated with prednisone 20 mg daily for 5 days (last dose reportedly 5/28). She was also on clarithromycin from 4/18 to 5/31. The patient is not complaining of cough or shortness of breath at this time. Will encourage pulmonology follow-up at discharge.   4. Hypertension Patient with longstanding hypertension. SBP > 180 on admission. Patient has not complained of pain or shortness of breath. No signs of end organ damage or pulmonary edema. Patient's HCTZ was held during this admission given hypokalemia. Patient has been on home metoprolol, losartan as well as Amlodipine 10 mg QD. Plan to discharge on this regimen with close PCP follow-up.   5. History of Meniere's Disease On diazepam 5 mg q8h and meclizine as needed for severe vertigo. These were held during her admission given concern that centrally acting medications could be contributing to her encephalopathy. Recommend PCP follow-up to evaluate necessity of diazepam given risk of altered mental status.   6. Hyperlipidemia Patient with longstanding hyperlipidemia on Atorvastatin at home. This was  continued during admission and will be continued at discharge.    7. Carotid artery stenosis CTA head and neck showed calcified plaque of the proximal internal carotid arteries resulting in approximately 50% stenosis bilaterally. Continue statin therapy as an outpatient.  8. Hypothyroidism Patient with longstanding hypothyroidism on levothyroxine at home. This was continued during admission and will be continued at discharge.    9. Gout Patient with history of gout and did have a recent flare that improved with  colchicine. She has not had any symptoms related to gout during this admission.  10. Normocytic anemia Hemoglobin trended down slightly from 10.5 to 9.8 early in her admission, then remained stable at 9.6. No signs of active bleeding. Patient had iron studies done at an outside hospital on 06/22/21 that showed low saturation % at 12. Patient's last colonoscopy was in 2015, and she was recommended for 10 year follow-up at that time. Recommend outpatient colonoscopy at discharge.   Discharge Exam:   BP (!) 111/48   Pulse 75   Temp 98.1 F (36.7 C) (Oral)   Resp 14   Ht 5\' 4"  (1.626 m)   Wt 55.8 kg   SpO2 96%   BMI 21.12 kg/m  Discharge exam:  Constitutional: elderly appearing, frail, in no acute distress HEENT: moist mucous membranes Cardiovascular: regular rate with normal rhythm, no m/r/g Pulmonary/Chest: normal work of breathing on room air, lungs clear to auscultation bilaterally Abdominal: soft, non-tender, non-distended, bowel sounds present MSK: decreased muscle bulk, no peripheral edema Skin: warm and dry. Neurological: alert and appropriately answering questions. Oriented to name only. She does not know why she is in the hospital. No focal deficits  Pertinent Labs, Studies, and Procedures:   MR BRAIN WO CONTRAST  Result Date: 07/02/2021 CLINICAL DATA:  Neuro deficit, acute, stroke suspected; aphasia EXAM: MRI HEAD WITHOUT CONTRAST TECHNIQUE: Multiplanar, multiecho pulse sequences of the brain and surrounding structures were obtained without intravenous contrast. COMPARISON:  None Available. FINDINGS: Motion artifact is present. Brain: There is no acute infarction or intracranial hemorrhage. There is no intracranial mass, mass effect, or edema. There is no hydrocephalus or extra-axial fluid collection. Prominence of the ventricles and sulci reflects parenchymal volume loss. Patchy and confluent areas of T2 hyperintensity in the supratentorial and pontine white matter are nonspecific  but probably reflect moderate chronic microvascular ischemic changes. Vascular: Major vessel flow voids at the skull base are preserved. Skull and upper cervical spine: Normal marrow signal is preserved. Sinuses/Orbits: Minor mucosal thickening.  Orbits are unremarkable. Other: Sella is unremarkable.  Mastoid air cells are clear. IMPRESSION: No acute infarction, hemorrhage, or mass. Moderate chronic microvascular ischemic changes. Electronically Signed   By: Macy Mis M.D.   On: 07/02/2021 12:12   DG Chest Port 1 View  Result Date: 07/02/2021 CLINICAL DATA:  Questionable sepsis - evaluate for abnormality EXAM: PORTABLE CHEST - 1 VIEW COMPARISON:  05/15/2021 FINDINGS: Cardiomediastinal silhouette and pulmonary vasculature are within normal limits. Biapical pleuroparenchymal scarring again seen. Interval improvement of right basilar opacity. IMPRESSION: Improving right basilar opacity likely related to improving pneumonia/bronchial plugging. Electronically Signed   By: Miachel Roux M.D.   On: 07/02/2021 08:49   EEG adult  Result Date: 07/02/2021 Lora Havens, MD     07/02/2021  3:09 PM Patient Name: Rebba Bristow MRN: [id number] Epilepsy Attending: Lora Havens Referring Physician/Provider: Amie Portland, MD Date: 07/02/2021 Duration: 25.20 mins Patient history: 80yo with transient speech disturbance. EEG to evaluate for seizure Level of alertness: Awake AEDs  during EEG study: None Technical aspects: This EEG study was done with scalp electrodes positioned according to the 10-20 International system of electrode placement. Electrical activity was acquired at a sampling rate of 500Hz  and reviewed with a high frequency filter of 70Hz  and a low frequency filter of 1Hz . EEG data were recorded continuously and digitally stored. Description: The posterior dominant rhythm consists of 8 Hz activity of moderate voltage (25-35 uV) seen predominantly in posterior head regions, symmetric and reactive to eye opening  and eye closing. EEG showed near continuous 3 to 5 Hz theta-delta slowing in left temporal region. Hyperventilation and photic stimulation were not performed.   ABNORMALITY - Continuous slow, left temporal region IMPRESSION: This study is suggestive of cortical dysfunction arising from left temporal region likely secondary to underlying structural abnormality, post-ictal state. No seizures or epileptiform discharges were seen throughout the recording. Priyanka Barbra Sarks   Overnight EEG with video  Result Date: 07/03/2021 Lora Havens, MD     07/03/2021 10:22 AM Patient Name: Tekeshia Matsubara MRN: [id number] Epilepsy Attending: Lora Havens Referring Physician/Provider: Amie Portland, MD Duration: 07/02/2021 1522 to 07/03/2021 1007  Patient history: 80yo with transient speech disturbance. EEG to evaluate for seizure  Level of alertness: Awake, asleep  AEDs during EEG study: LEV  Technical aspects: This EEG study was done with scalp electrodes positioned according to the 10-20 International system of electrode placement. Electrical activity was acquired at a sampling rate of 500Hz  and reviewed with a high frequency filter of 70Hz  and a low frequency filter of 1Hz . EEG data were recorded continuously and digitally stored.  Description: The posterior dominant rhythm consists of 8 Hz activity of moderate voltage (25-35 uV) seen predominantly in posterior head regions, symmetric and reactive to eye opening and eye closing.  Sleep was characterized by vertex waves, sleep spindles (12 to 14 Hz), maximal frontocentral region.  EEG showed intermittent 3 to 5 Hz theta-delta slowing in left temporal region. Hyperventilation and photic stimulation were not performed.    ABNORMALITY - Intermittent slow, left temporal region  IMPRESSION: This study is suggestive of cortical dysfunction arising from left temporal region, non specific etiology. No seizures or epileptiform discharges were seen throughout the recording.  Priyanka Barbra Sarks   CT HEAD CODE STROKE WO CONTRAST  Result Date: 07/02/2021 CLINICAL DATA:  Aphasia EXAM: CT ANGIOGRAPHY HEAD AND NECK CT PERFUSION BRAIN TECHNIQUE: Multidetector CT imaging of the head and neck was performed using the standard protocol during bolus administration of intravenous contrast. Multiplanar CT image reconstructions and MIPs were obtained to evaluate the vascular anatomy. Carotid stenosis measurements (when applicable) are obtained utilizing NASCET criteria, using the distal internal carotid diameter as the denominator. Multiphase CT imaging of the brain was performed following IV bolus contrast injection. Subsequent parametric perfusion maps were calculated using RAPID software. RADIATION DOSE REDUCTION: This exam was performed according to the departmental dose-optimization program which includes automated exposure control, adjustment of the mA and/or kV according to patient size and/or use of iterative reconstruction technique. CONTRAST:  119mL OMNIPAQUE IOHEXOL 350 MG/ML SOLN COMPARISON:  None Available. FINDINGS: CT HEAD FINDINGS Brain: There is no evidence of acute intracranial hemorrhage, extra-axial fluid collection, or acute infarct. Parenchymal volume is normal for age. The ventricles are normal in size. Gray-white differentiation is preserved. Patchy hypodensity throughout the subcortical and periventricular white matter likely reflects sequela of underlying chronic white matter microangiopathy. There is no solid mass lesion. There is no mass effect or midline shift. Vascular:  There is a small extra-axial calcification overlying the right parietal lobe which may reflect calcific emboli of indeterminate age, but there is no occlusion on the subsequently obtained vascular imaging described below. Skull: Normal. Negative for fracture or focal lesion. Sinuses/Orbits: Paranasal sinuses are clear. Bilateral lens implants are in place. The globes and orbits are otherwise unremarkable. Other:  None. ASPECTS (Alberta Stroke Program Early CT Score) - Ganglionic level infarction (caudate, lentiform nuclei, internal capsule, insula, M1-M3 cortex): 7 - Supraganglionic infarction (M4-M6 cortex): 3 Total score (0-10 with 10 being normal): 10 CTA NECK FINDINGS Aortic arch: Imaged aortic arch is normal. The origins of the major branch vessels are patent. The subclavian arteries are patent to the level imaged. Right carotid system: The right common carotid artery is patent. There is bulky calcified plaque in the proximal right internal carotid artery resulting in up to approximately 50% stenosis. The distal right internal carotid artery is patent. The right external carotid artery is patent. There is no evidence of dissection or aneurysm. Left carotid system: The left common carotid artery is patent. There is calcified plaque in the proximal left internal carotid artery resulting in up to approximately 50% stenosis. The distal left internal carotid artery is patent. The left external carotid artery is patent. There is no evidence of dissection or aneurysm. Vertebral arteries: Vertebral arteries are patent, without hemodynamically significant stenosis or occlusion. There is no dissection or aneurysm. Skeleton: There is overall mild multilevel degenerative change throughout the cervical spine. There is no acute osseous abnormality or suspicious osseous lesion. There is no visible canal hematoma. Other neck: The soft tissues of the neck are unremarkable. Upper chest: There is nodular scarring in the lung apices, right more than left, unchanged since the recent chest CT from 06/25/2021. A prominent left upper mediastinal lymph node measuring up to 8 mm is unchanged since the recent CT, nonspecific. Review of the MIP images confirms the above findings CTA HEAD FINDINGS Anterior circulation: There is mild calcified plaque in the intracranial ICAs without hemodynamically significant stenosis. The bilateral MCAs are patent  The bilateral ACAs are patent. The anterior communicating artery is normal. There is no aneurysm or AVM. Posterior circulation: The bilateral V4 segments are patent. PICA is identified bilaterally. The basilar artery is patent. The bilateral PCAs are patent. Posterior communicating arteries are not identified. There is no aneurysm or AVM. Venous sinuses: Patent. Anatomic variants: None. Review of the MIP images confirms the above findings CT Brain Perfusion Findings: ASPECTS: 10 CBF (<30%) Volume: 30mL Perfusion (Tmax>6.0s) volume: 68mL Mismatch Volume: 50mL Infarction Location:N/a IMPRESSION: 1. No acute intracranial hemorrhage or infarct.  ASPECTS is 10 2. No infarct core or penumbra identified by CT perfusion. 3. Calcified plaque of the proximal internal carotid arteries resulting in 50% approximately stenosis bilaterally. 4. Patent intracranial vasculature. The results of the initial noncontrast head CT were discussed with Dr. Wilford Corner at 8:18 am. The CTA and CTP results were discussed at 8:33am. Electronically Signed   By: Lesia Hausen M.D.   On: 07/02/2021 08:38   CT ANGIO HEAD NECK W WO CM W PERF (CODE STROKE)  Result Date: 07/02/2021 CLINICAL DATA:  Aphasia EXAM: CT ANGIOGRAPHY HEAD AND NECK CT PERFUSION BRAIN TECHNIQUE: Multidetector CT imaging of the head and neck was performed using the standard protocol during bolus administration of intravenous contrast. Multiplanar CT image reconstructions and MIPs were obtained to evaluate the vascular anatomy. Carotid stenosis measurements (when applicable) are obtained utilizing NASCET criteria, using the distal internal carotid  diameter as the denominator. Multiphase CT imaging of the brain was performed following IV bolus contrast injection. Subsequent parametric perfusion maps were calculated using RAPID software. RADIATION DOSE REDUCTION: This exam was performed according to the departmental dose-optimization program which includes automated exposure control,  adjustment of the mA and/or kV according to patient size and/or use of iterative reconstruction technique. CONTRAST:  OMNIPAQUE IOHEXOL 350 MG/ML SOLN COMPARISON:  None Available. FINDINGS: CT HEAD FINDINGS Brain: There is no evidence of acute intracranial hemorrhage, extra-axial fluid collection, or acute infarct. Parenchymal volume is normal for age. The ventricles are normal in size. Gray-white differentiation is preserved. Patchy hypodensity throughout the subcortical and periventricular white matter likely reflects sequela of underlying chronic white matter microangiopathy. There is no solid mass lesion. There is no mass effect or midline shift. Vascular: There is a small extra-axial calcification overlying the right parietal lobe which may reflect calcific emboli of indeterminate age, but there is no occlusion on the subsequently obtained vascular imaging described below. Skull: Normal. Negative for fracture or focal lesion. Sinuses/Orbits: Paranasal sinuses are clear. Bilateral lens implants are in place. The globes and orbits are otherwise unremarkable. Other: None. ASPECTS (Alberta Stroke Program Early CT Score) - Ganglionic level infarction (caudate, lentiform nuclei, internal capsule, insula, M1-M3 cortex): 7 - Supraganglionic infarction (M4-M6 cortex): 3 Total score (0-10 with 10 being normal): 10 CTA NECK FINDINGS Aortic arch: Imaged aortic arch is normal. The origins of the major branch vessels are patent. The subclavian arteries are patent to the level imaged. Right carotid system: The right common carotid artery is patent. There is bulky calcified plaque in the proximal right internal carotid artery resulting in up to approximately 50% stenosis. The distal right internal carotid artery is patent. The right external carotid artery is patent. There is no evidence of dissection or aneurysm. Left carotid system: The left common carotid artery is patent. There is calcified plaque in the proximal  left internal carotid artery resulting in up to approximately 50% stenosis. The distal left internal carotid artery is patent. The left external carotid artery is patent. There is no evidence of dissection or aneurysm. Vertebral arteries: Vertebral arteries are patent, without hemodynamically significant stenosis or occlusion. There is no dissection or aneurysm. Skeleton: There is overall mild multilevel degenerative change throughout the cervical spine. There is no acute osseous abnormality or suspicious osseous lesion. There is no visible canal hematoma. Other neck: The soft tissues of the neck are unremarkable. Upper chest: There is nodular scarring in the lung apices, right more than left, unchanged since the recent chest CT from 06/25/2021. A prominent left upper mediastinal lymph node measuring up to 8 mm is unchanged since the recent CT, nonspecific. Review of the MIP images confirms the above findings CTA HEAD FINDINGS Anterior circulation: There is mild calcified plaque in the intracranial ICAs without hemodynamically significant stenosis. The bilateral MCAs are patent The bilateral ACAs are patent. The anterior communicating artery is normal. There is no aneurysm or AVM. Posterior circulation: The bilateral V4 segments are patent. PICA is identified bilaterally. The basilar artery is patent. The bilateral PCAs are patent. Posterior communicating arteries are not identified. There is no aneurysm or AVM. Venous sinuses: Patent. Anatomic variants: None. Review of the MIP images confirms the above findings CT Brain Perfusion Findings: ASPECTS: 10 CBF (<30%) Volume: 79mL Perfusion (Tmax>6.0s) volume: 65mL Mismatch Volume: 83mL Infarction Location:N/a IMPRESSION: 1. No acute intracranial hemorrhage or infarct.  ASPECTS is 10 2. No infarct core or penumbra identified  by CT perfusion. 3. Calcified plaque of the proximal internal carotid arteries resulting in 50% approximately stenosis bilaterally. 4. Patent  intracranial vasculature. The results of the initial noncontrast head CT were discussed with Dr. Rory Percy at 8:18 am. The CTA and CTP results were discussed at 8:33am. Electronically Signed   By: Valetta Mole M.D.   On: 07/02/2021 08:38       Latest Ref Rng & Units 07/07/2021    8:43 AM 07/06/2021    2:38 AM 07/05/2021    2:33 AM  CBC  WBC 4.0 - 10.5 K/uL 5.8   6.8   6.4    Hemoglobin 12.0 - 15.0 g/dL 10.0   9.8   9.6    Hematocrit 36.0 - 46.0 % 28.9   29.7   28.3    Platelets 150 - 400 K/uL 166   140   144         Latest Ref Rng & Units 07/08/2021    7:59 AM 07/07/2021    8:43 AM 07/06/2021    2:38 AM  BMP  Glucose 70 - 99 mg/dL 92   107   83    BUN 8 - 23 mg/dL 21   23   15     Creatinine 0.44 - 1.00 mg/dL 1.23   1.23   1.13    Sodium 135 - 145 mmol/L 134   131   133    Potassium 3.5 - 5.1 mmol/L 4.5   4.5   4.4    Chloride 98 - 111 mmol/L 98   97   97    CO2 22 - 32 mmol/L 27   25   22     Calcium 8.9 - 10.3 mg/dL 9.4   9.0   9.5      Discharge Instructions: Discharge Instructions     Call MD for:  persistant dizziness or light-headedness   Complete by: As directed    Call MD for:  persistant nausea and vomiting   Complete by: As directed    Call MD for:  temperature >100.4   Complete by: As directed    Diet - low sodium heart healthy   Complete by: As directed    Discharge instructions   Complete by: As directed    Dear Ms. Edison Pace,  It was a pleasure taking care of you while you were in the hospital. You were hospitalized for confusion and disorientation. We suspect that this was caused by low magnesium, low potassium, and low calcium that was seen on your labs. This could also have been caused by a seizure, but we cannot say that definitively. In addition, people can become confused in the hospital because they are not familiar with their surroudings. Thank you for allowing [place] to be part of your care.    We arranged for you to go to [facility] in [place].     Please  note these changes made to your medications:    Please START taking: Amlodipine 10 mg every day.                                                  Keppra 500 mg twice per day     Please STOP taking: HCTZ 12.5 mg     Please make sure to drink plenty of fluids and eat three meals per day.  It is very important that you continue to take Keppra until you are able to follow up with a neurologist. Please follow up with your primary doctor as soon as you are able to.    Please call our clinic if you have any questions or concerns, we may be able to help and keep you from a long and expensive emergency room wait. Our clinic and after hours phone number is [phone], the best time to call is Monday through Friday 9 am to 4 pm but there is always someone available 24/7 if you have an emergency. If you need medication refills please notify your pharmacy one week in advance and they will send [place] a request.   Take care! [place] [facility]   Increase activity slowly   Complete by: As directed        You were hospitalized for confusion and disorientation. We suspect that this was caused by low magnesium, low potassium, and low calcium that was seen on your labs. This could also have been caused by a seizure, but we cannot say that definitively. In addition, people can become confused in the hospital because they are not familiar with their surroudings. Thank you for allowing [place] to be part of your care.   We arranged for you to go to [facility] in [place].   Please note these changes made to your medications:   Please START taking: Amlodipine 10 mg every day.       Keppra 500 mg twice per day   Please STOP taking: HCTZ 12.5 mg   Please make sure to drink plenty of fluids and eat three meals per day. It is very important that you continue to take Keppra until you are able to follow up with a neurologist. Please follow up with your primary doctor as soon as you are able to.   Please  call our clinic if you have any questions or concerns, we may be able to help and keep you from a long and expensive emergency room wait. Our clinic and after hours phone number is [phone], the best time to call is Monday through Friday 9 am to 4 pm but there is always someone available 24/7 if you have an emergency. If you need medication refills please notify your pharmacy one week in advance and they will send [place] a request.   Signed: France Ravens, MD 07/08/2021, 10:36 AM   Pager: [phone]

## 2021-07-07 NOTE — Discharge Instructions (Signed)
You were hospitalized for confusion and disorientation. We suspect that this was caused by low magnesium, low potassium, and low calcium that was seen on your labs. This could also have been caused by a seizure, but we cannot say that definitively. In addition, people can become confused in the hospital because they are not familiar with their surroudings. Thank you for allowing [place] to be part of your care.    We arranged for you to go to [facility] in [place].     Please note these changes made to your medications:    Please START taking: Amlodipine 10 mg every day.                                     Keppra 500 mg twice per day     Please STOP taking: HCTZ 12.5 mg     Please make sure to drink plenty of fluids and eat three meals per day. It is very important that you continue to take Keppra until you are able to follow up with a neurologist. Please follow up with your primary doctor as soon as you are able to.    Please call our clinic if you have any questions or concerns, we may be able to help and keep you from a long and expensive emergency room wait. Our clinic and after hours phone number is [phone], the best time to call is Monday through Friday 9 am to 4 pm but there is always someone available 24/7 if you have an emergency. If you need medication refills please notify your pharmacy one week in advance and they will send [place] a request.

## 2021-07-07 NOTE — Progress Notes (Signed)
Subjective:  Overnight Events: No acute events or concerns overnight.  The patient was seen and evaluated on rounds this morning. She states that she is feeling well but cannot recall why she is in the hospital or how long she has been in the hospital. She denies any pain. She states that she does not remember our conversation with her sister yesterday about the plan for her to go to [facility].   Objective:  Vital signs in last 24 hours: Vitals:   07/07/21 0828 07/07/21 1124 07/07/21 1506 07/07/21 1511  BP: (!) 125/52 (!) 121/45 (!) 104/41 (!) 108/47  Pulse: 75 68 70 70  Resp: 14 16 14    Temp: 97.7 F (36.5 C) 98.5 F (36.9 C) 98.2 F (36.8 C)   TempSrc: Oral Oral Oral   SpO2: 100% 100% 96%   Weight:      Height:       Weight change:   Intake/Output Summary (Last 24 hours) at 07/07/2021 1602 Last data filed at 07/07/2021 1500 Gross per 24 hour  Intake 418 ml  Output 650 ml  Net -232 ml    Physical Exam   Constitutional: elderly appearing, frail, in no acute distress HEENT: moist mucous membranes Cardiovascular: regular rate with normal rhythm, no m/r/g Pulmonary/Chest: normal work of breathing on room air, lungs clear to auscultation bilaterally Abdominal: soft, non-tender, non-distended, bowel sounds present MSK: decreased bulk, normal tone, no peripheral edema Skin: warm and dry. Neurological: alert and appropriately answering questions. Oriented to name only. She does not know why she is in the hospital.  Assessment/Plan:  Principal Problem:   Acute encephalopathy Active Problems:   Uncontrolled hypertension   Hypokalemia   Hypomagnesemia   Stage 3a chronic kidney disease (HCC)  [name] is an 42 yoF with HTN, HLD, hypothyroidism, gout, GERD, carotid artery stenosis, asthma and recent pneumonia presenting with acute encephalopathy and expressive aphasia likely secondary to electrolyte abnormalities with hypokalemia, hypocalcemia, hypomagnesemia. She  now appears to have some delirium likely related to her being in the hospital.   Acute encephalopathy, resolved Expressive aphasia Possible seizure / post-ictal state  Electrolyte Disturbances Hospital Delirium Today patient remains confused and does not remember our conversation regarding SNF yesterday. Patient does have a bed at [facility] in [place], but her insurance authorization has been delayed due to a power outage reported by her insurance company. Anticipate discharge to SNF tomorrow. -Keppra 500 mg BID per neurology recommendation. EEG was abnormal with left temporal slowing but no epileptiform activity. Outpatient follow for further discussion whether to continue this long term.  -Replete magnesium -Daily BMP, replete electrolytes as necessary -Hold diazepam, meclizine -Delirium precautions -SNF placement pending insurance authorization  Acute Kidney Injury Hyponatremia  Patient's creatinine today is 1.23 and GFR is 44, down from >60 on admission. Sodium is down to 131 from 133 yesterday. Suspect hypovolemic hyponatremia given her AKI and decreased fluid intake. Plan to give 1L LR bolus, encourage PO fluid intake, and recheck BMP. -Recheck BMP in the morning -Encourage PO fluid intake -1L LR bolus   Recent pneumonia  Bronchiectasis  Asthma Recently followed by outpatient pulmonology for recent pneumonia and bronchiectasis. The patient is not complaining of cough or shortness of breath at this time. - Continue combivent prn - Maintain oxygen saturations >92% - Pulmonary hygiene      Uncontrolled HTN SBP >180 on admission. Currently 125/52. Patient is not complaining of any pain or shortness of breath but is still oriented to name  only. Elevated blood pressures could be contributing to AMS but less likely without signs of end organ damage or pulmonary edema.  -Will resume home metoprolol succinate, losartan. Holding HCTZ given hypokalemia. -Continue Amlodipine 10 mg  qd.   History of Meniere's Disease On diazepam 5 mg q8h and meclizine as needed for severe vertigo. Holding these now and would recommend discontinuing both as this could be a possible cause of patient's confusion.    HLD Carotid artery stenosis -Continue atorvastatin    Normocytic anemia Hemoglobin trended down slightly from 10.5 to 9.8 earlier in admission, now stable at 9.8. No signs of active bleeding. Patient had iron studies done at an outside hospital on 06/22/21 that showed low saturation % at 12. Patient's last colonoscopy was in 2015, and she was recommended for 10 year follow-up at that time. Will consider outpatient colonoscopy at discharge. Plan to trend CBC here. -Trend CBC   Hypothyroidism -Continue levothyroxine    Gout Recently had a gout flare and was on colchicine.  Diet: Regular VTE: Lovenox IVF: None Code: Full   Prior to Admission Living Arrangement: Home Anticipated Discharge Location: [facility] Barriers to Discharge: SNF placement Dispo: Anticipated discharge in approximately 1 day.   Elane Fritz, MS3 07/05/2021

## 2021-07-08 LAB — BASIC METABOLIC PANEL
Anion gap: 9 (ref 5–15)
BUN: 21 mg/dL (ref 8–23)
CO2: 27 mmol/L (ref 22–32)
Calcium: 9.4 mg/dL (ref 8.9–10.3)
Chloride: 98 mmol/L (ref 98–111)
Creatinine, Ser: 1.23 mg/dL — ABNORMAL HIGH (ref 0.44–1.00)
GFR, Estimated: 44 mL/min — ABNORMAL LOW (ref >60)
Glucose, Bld: 92 mg/dL (ref 70–99)
Potassium: 4.5 mmol/L (ref 3.5–5.1)
Sodium: 134 mmol/L — ABNORMAL LOW (ref 135–145)

## 2021-07-08 MED ORDER — HYDROXYZINE HCL 10 MG PO TABS: 10.0000 mg | ORAL_TABLET | Freq: Three times a day (TID) | ORAL | Status: DC | PRN

## 2021-07-08 MED ORDER — AMLODIPINE BESYLATE 10 MG PO TABS: 10.0000 mg | ORAL_TABLET | Freq: Every day | ORAL | Status: AC

## 2021-07-08 MED ORDER — MELATONIN 3 MG PO TABS: 3.0000 mg | ORAL_TABLET | Freq: Every day | ORAL | 0 refills | Status: AC

## 2021-07-08 MED ORDER — METOPROLOL SUCCINATE ER 50 MG PO TB24: 50.0000 mg | ORAL_TABLET | Freq: Every day | ORAL | Status: AC

## 2021-07-08 MED ORDER — LEVETIRACETAM 500 MG PO TABS: 500.0000 mg | ORAL_TABLET | Freq: Two times a day (BID) | ORAL | Status: DC

## 2021-07-08 NOTE — TOC Transition Note (Signed)
Transition of Care [facility]) - CM/SW Discharge Note   Patient Details  Name: Zimal Biedron MRN: [id number] Date of Birth: 01-02-1941  Transition of Care [facility]) CM/SW Contact:  Geralynn Ochs, LCSW Phone Number: 07/08/2021, 10:55 AM   Clinical Narrative:   CSW notified that authorization has been received, patient able to discharge today. CSW sent discharge information to [facility], and updated family via phone. Transport scheduled with [facility] for next available.  Nurse to call report to [phone], Room 6A.    Final next level of care: Skilled Nursing Facility Barriers to Discharge: Barriers Resolved   Patient Goals and CMS Choice Patient states their goals for this hospitalization and ongoing recovery are:: Patient unable to participate in goal setting, not fully oriented CMS Medicare.gov Compare Post Acute Care list provided to:: Patient Represenative (must comment) Choice offered to / list presented to : Sibling  Discharge Placement              Patient chooses bed at: [facility] Patient to be transferred to facility by: [place] Name of family member notified: Debbie Patient and family notified of of transfer: 07/08/21  Discharge Plan and Services     Post Acute Care Choice: [place]                               Social Determinants of Health (SDOH) Interventions     Readmission Risk Interventions     View : No data to display.

## 2021-07-08 NOTE — Plan of Care (Signed)

## 2021-07-08 NOTE — Care Management Important Message (Signed)
Important Message  Patient Details  Name: Crystal Rogers MRN: [id number] Date of Birth: 10/15/1940   Medicare Important Message Given:  Yes  Patient left prior to IM delivery will mail to the patient home address.     Kauri Garson 07/08/2021, 2:35 PM

## 2021-07-08 NOTE — TOC Progression Note (Signed)
Transition of Care [facility]) - Progression Note    Patient Details  Name: Crystal Rogers MRN: [id number] Date of Birth: Feb 01, 1941  Transition of Care [facility]) CM/SW [place], [place] Phone Number: 07/08/2021, 10:54 AM  Clinical Narrative:   CSW following for SNF placement. Insurance still pending at this time. TOC Leadership asked if [facility] would [facility] a 5 day LOG pending authorization. CSW called [facility] but they would not accept that, they need authorization first. CSW to follow.    Expected Discharge Plan: [place] Barriers to Discharge: Continued Medical Work up, Ship broker  Expected Discharge Plan and Services Expected Discharge Plan: [place] Choice: [place] arrangements for the past 2 months: Single Family Home Expected Discharge Date: 07/07/21                                     Social Determinants of Health (SDOH) Interventions    Readmission Risk Interventions     View : No data to display.

## 2021-08-13 ENCOUNTER — Encounter: Payer: Self-pay | Admitting: Neurology

## 2021-08-13 ENCOUNTER — Ambulatory Visit: Payer: Medicare Other | Admitting: Neurology

## 2021-08-13 VITALS — BP 143/59 | HR 86 | Ht 65.6 in | Wt 117.5 lb

## 2021-08-13 DIAGNOSIS — R410 Disorientation, unspecified: Secondary | ICD-10-CM

## 2021-08-13 DIAGNOSIS — G9340 Encephalopathy, unspecified: Secondary | ICD-10-CM

## 2021-08-13 DIAGNOSIS — R9401 Abnormal electroencephalogram [EEG]: Secondary | ICD-10-CM

## 2021-08-13 NOTE — Progress Notes (Signed)
[facility]  PATIENT: Crystal Rogers DOB: May 30, 1940  REQUESTING CLINICIAN: No ref. provider found HISTORY FROM: Patient and Niece Hilda Blades  REASON FOR VISIT: Encephalopathy follow up.    HISTORICAL  CHIEF COMPLAINT:  Chief Complaint  Patient presents with   Hospitalization Follow-up    Rm 12. Accompanied by niece, Hilda Blades. hospital f/u for possible seizure.    HISTORY OF PRESENT ILLNESS:  This is a 81 year old woman past medical history of hypertension, hyperlipidemia, CAD, GERD who is presenting after being admitted to the hospital for acute encephalopathy.  During her admission, she was also found to have electrolyte imbalance.  Work-up including MRI was negative for this stroke and her EEG showed left focal slowing.  Because of the confusion and abnormal EEG she was started on Keppra 500 mg twice daily.   Prior to hospitalization, her niece Hilda Blades reports patient was independent, she lives alone and took care of her sister. She was driving, paying her own bills, doing groceries.  During this period, she will have a period of confusion when with poor sleep but nothing that required them to take her to the hospital.  She has a diagnosis of vertigo, was put on meclizine and Valium when she is symptomatic.  Prior to hospitalization she reported taking the Valium due to the vertigo and having also poor sleep.  During the week prior to hospitalization, she was also diagnosed with pneumonia and was on antibiotics. Since discharge from the hospital she has been in a subacute rehab.  Initially she was put in a memory unit and she did well to the point that she was transferred to a regular floor but continues to have episode of confusion. She continued to improve but still have some confusion.  She is wanting to go home and also family wants her to come home.  They are trying to set her home with home health services.   Patient reports being compliant with Keppra but noted that she  has some nausea with the medication.  Denies any other side effect.     Hospital course  1. Acute encephalopathy, Hospital Delirium Patient presented to the ED with confusion and expressive aphasia likely secondary to electrolyte abnormalities. Patient had negative CT head and MRI. CTA head and neck showed persistent carotid artery stenosis. EEG showed focal slowing with no current seizure activity. Patient was afebrile with no leukocytosis, unremarkable UA, normal lactate. 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 Patient presented hypokalemic at 2.7, hypocalcemic with ionized calcium 0.8, hypomagnesemic to 1.4. Other possible cause of her encephalopathy is polypharmacy as she take diazepam and meclizine at home. Neurology evaluated the patient and thought that she may have had a previous seizure given slowing on EEG. They loaded the patient with Keppra, and she has remained on Keppra 500 mg BID throughout admission. They recommend discharging her on this, and they will see her has an outpatient to determine whether Keppra is indicated long term. The patient's electrolytes were repleted, though she did not show signs of significant improvement. Her magnesium did drop again during admission likely secondary to decreased PO intake. This normalized after repletion. Suspect that her continued confusion and disorientation is secondary to hospital delirium. Given her confusion and disorientation, PT/OT recommended SNF rehab prior to going home. Patient initially refused this, tearfully stating that she wanted to go home with her sister. After speaking with her sister and her niece, they stated that they would not feel comfortable  caring for the patient at home. Care team here found her bed placement at [facility] in [place] near her family. Patient became agreeable to this after talking to her sister and niece and learning that they would be able to visit her there   [place]: Hypertension, hyperlipidemia, CAD, GERD   REVIEW OF SYSTEMS: Full 14 system review of systems performed and negative with exception of: as noted in the HPI   ALLERGIES: Allergies  Allergen Reactions   Allopurinol Hives   Augmentin [Amoxicillin-Pot Clavulanate] Nausea And Vomiting   Doxycycline Hives   Levaquin [Levofloxacin] Other (See Comments)    insomnia   Zithromax [Azithromycin] Diarrhea    HOME MEDICATIONS: Outpatient Medications Prior to Visit  Medication Sig Dispense Refill   amLODipine (NORVASC) 10 MG tablet Take 1 tablet (10 mg total) by mouth daily.     aspirin EC 81 MG tablet Take 1 tablet by mouth daily.     Cholecalciferol 50 MCG (2000 UT) CAPS Take 1 capsule by mouth daily.     Coenzyme Q10 200 MG capsule Take 200 mg by mouth daily.     Ipratropium-Albuterol (COMBIVENT RESPIMAT) 20-100 MCG/ACT AERS respimat Inhale 1 puff into the lungs every 6 (six) hours.     levETIRAcetam (KEPPRA) 500 MG tablet Take 1 tablet (500 mg total) by mouth 2 (two) times daily.     levothyroxine (SYNTHROID) 100 MCG tablet Take 100 mcg by mouth every morning.     melatonin 3 MG TABS tablet Take 1 tablet (3 mg total) by mouth at bedtime.  0   metoprolol succinate (TOPROL-XL) 50 MG 24 hr tablet Take 1 tablet (50 mg total) by mouth daily. Take with or immediately following a meal.     montelukast (SINGULAIR) 10 MG tablet Take 1 tablet by mouth daily.     omeprazole (PRILOSEC) 40 MG capsule Take 40 mg by mouth 2 (two) times daily.     albuterol (VENTOLIN HFA) 108 (90 Base) MCG/ACT inhaler Inhale 1-2 puffs into the lungs every 6 (six) hours as needed for wheezing.     Ipratropium-Albuterol (COMBIVENT) 20-100 MCG/ACT AERS respimat Inhale 1 puff into the lungs every 6 (six) hours as needed.     Polyethyl Glycol-Propyl Glycol 0.4-0.3 % SOLN Apply 1 drop to eye daily.     No facility-administered medications prior to visit.    PAST MEDICAL HISTORY: Past Medical History:   Diagnosis Date   Asthma    Carotid artery stenosis    GERD (gastroesophageal reflux disease)    Gout    Hyperlipidemia    Hypertension    Hypothyroid    IFG (impaired fasting glucose)    Renal disorder     PAST SURGICAL HISTORY: History reviewed. No pertinent surgical history.  FAMILY HISTORY: Family History  Problem Relation Age of Onset   Emphysema Mother    Stroke Father     SOCIAL HISTORY: Social History   Socioeconomic History   Marital status: Single    Spouse name: Not on file   Number of children: Not on file   Years of education: Not on file   Highest education level: Not on file  Occupational History   Not on file  Tobacco Use   Smoking status: Never   Smokeless tobacco: Never  Substance and Sexual Activity   Alcohol use: Never   Drug use: Not on file   Sexual activity: Not on file  Other Topics Concern   Not on file  Social History Narrative  Not on file   Social Determinants of Health   Financial Resource Strain: Not on file  Food Insecurity: Not on file  Transportation Needs: Not on file  Physical Activity: Not on file  Stress: Not on file  Social Connections: Not on file  Intimate Partner Violence: Not on file    PHYSICAL EXAM  GENERAL EXAM/CONSTITUTIONAL: Vitals:  Vitals:   08/13/21 0928  BP: (!) 143/59  Pulse: 86  Weight: 117 lb 8 oz (53.3 kg)  Height: 5' 5.6" (1.666 m)   Body mass index is 19.2 kg/m. Wt Readings from Last 3 Encounters:  08/13/21 117 lb 8 oz (53.3 kg)  07/02/21 123 lb 0.3 oz (55.8 kg)   Patient is in no distress; well developed, nourished and groomed; neck is supple. Bilateral pitting edema up to ankles   EYES: Pupils round and reactive to light, Visual fields full to confrontation, Extraocular movements intacts,   MUSCULOSKELETAL: Gait, strength, tone, movements noted in Neurologic exam below  NEUROLOGIC: MENTAL STATUS:      No data to display         awake, alert, oriented to person, place  and time recent and remote memory intact normal attention and concentration language fluent, comprehension intact, naming intact fund of knowledge appropriate  CRANIAL NERVE:  2nd, 3rd, 4th, 6th - pupils equal and reactive to light, visual fields full to confrontation, extraocular muscles intact, no nystagmus 5th - facial sensation symmetric 7th - facial strength symmetric 8th - hearing intact 9th - palate elevates symmetrically, uvula midline 11th - shoulder shrug symmetric 12th - tongue protrusion midline  MOTOR:  normal bulk and tone, full strength in the BUE, BLE  SENSORY:  normal and symmetric to light touch  COORDINATION:  finger-nose-finger, fine finger movements normal  REFLEXES:  deep tendon reflexes present and symmetric  GAIT/STATION:  Walk with a walker     DIAGNOSTIC DATA (LABS, IMAGING, TESTING) - I reviewed patient records, labs, notes, testing and imaging myself where available.  Lab Results  Component Value Date   WBC 5.8 07/07/2021   HGB 10.0 (L) 07/07/2021   HCT 28.9 (L) 07/07/2021   MCV 85.8 07/07/2021   PLT 166 07/07/2021      Component Value Date/Time   NA 134 (L) 07/08/2021 0759   K 4.5 07/08/2021 0759   CL 98 07/08/2021 0759   CO2 27 07/08/2021 0759   GLUCOSE 92 07/08/2021 0759   BUN 21 07/08/2021 0759   CREATININE 1.23 (H) 07/08/2021 0759   CALCIUM 9.4 07/08/2021 0759   PROT 6.7 07/02/2021 0808   ALBUMIN 3.8 07/02/2021 0808   AST 15 07/02/2021 0808   ALT 13 07/02/2021 0808   ALKPHOS 82 07/02/2021 0808   BILITOT 1.2 07/02/2021 0808   GFRNONAA 44 (L) 07/08/2021 0759   No results found for: "CHOL", "HDL", "LDLCALC", "LDLDIRECT", "TRIG", "CHOLHDL" Lab Results  Component Value Date   HGBA1C 4.5 (L) 07/02/2021   No results found for: "VITAMINB12" Lab Results  Component Value Date   TSH 1.184 07/03/2021   MRI Brain 07/02/21 No acute infarction, hemorrhage, or mass. Moderate chronic microvascular ischemic changes  EEG 07/02/21 -  Continuous slow, left temporal region   ASSESSMENT AND PLAN  81 y.o. year old female with Hypertension, hyperlipidemia, CAD, GERD who is presenting after hospital admission for confusion and encephalopathy.  Confusion possibly related to medications side effect as her urine was positive for benzodiazepine.  Patient reported taking meclizine and Valium for vertigo.  She also had abnormal  EEG, started on Keppra 500 mg twice daily.  Since discharge, she is in subacute rehab but still reports episode of confusion.  At this time since it is unclear if patient actually had a seizure and since this is her first event, I think is reasonable to hold Keppra and to see if there is any improvement in her mental status. Advised family to contact me if patient has any seizure or seizure-like activity, at that time low threshold to restart Keppra.  I think is reasonable for patient to be discharged home with home health services.  Prior to hospitalization her niece noted that she was independent and actually took care of her sister.  I will see her in 6 months for a formal memory exam.  Follow-up sooner if worse.   1. Encephalopathy   2. Abnormal EEG   3. Episode of confusion      Patient Instructions  Discontinue Keppra, can contribute to confusion  Continue your other medications  Ok for patient to be discharge home with home health services  Use compression socks for the bilateral pitting edema  Return in 6 months for formal memory evaluation.   No orders of the defined types were placed in this encounter.   No orders of the defined types were placed in this encounter.   Return in about 6 months (around 02/13/2022) for For formal memory evaluation .  I have spent a total of 50 minutes dedicated to this patient today, preparing to see patient, performing a medically appropriate examination and evaluation, ordering tests and/or medications and procedures, and counseling and educating the  patient/family/caregiver; independently interpreting result and communicating results to the family/patient/caregiver; and documenting clinical information in the electronic medical record.   Windell Norfolk, MD 08/13/2021, 10:18 AM  [facility] [address] [place], [place] [address] [phone]

## 2021-08-13 NOTE — Patient Instructions (Signed)
Discontinue Keppra, can contribute to confusion  Continue your other medications  Ok for patient to be discharge home with home health services  Use compression socks for the bilateral pitting edema  Return in 6 months for formal memory evaluation.

## 2022-02-15 ENCOUNTER — Ambulatory Visit: Payer: Medicare Other | Admitting: Neurology

## 2022-12-13 NOTE — Progress Notes (Unsigned)
Patient: Crystal Rogers Date of Birth: [birth year]  Reason for Visit: Follow up History from: Patient, niece  Primary Neurologist: [facility]  ASSESSMENT AND PLAN 81 y.o. year old female   1.  Continued episodes of confusion 2.  Abnormal EEG (June 2023 intermittent slowing left temporal region, suggestive of cortical dysfunction arising from left temporal region.  No seizures were seen) 3.  Acute encephalopathy, hospital delirium June 2023 4.  Memory impairment (MMSE  24/30, could not complete MOCA)  -Unclear episodes of confusion are seizure related, seems more consistent with dementia -Check ATN profile, check B12 level -Start Depakote ER 250 mg at bedtime for mood, confusion, trial for underlying seizures? Can increase if needed -Also, on Valium 5 mg nightly, potentially increasing confusion per niece, may try 2.5 mg and monitor for worsening. Make 1 change at a time -Check routine EEG -Would recommend increased supervision, refrain from driving  -Follow-up with Dr. Teresa Coombs in 4 months or sooner if needed  Orders Placed This Encounter  Procedures   ATN PROFILE   Vitamin B12   EEG adult   HISTORY OF PRESENT ILLNESS: Today 12/14/22 Last saw Dr. Teresa Coombs 08/13/2021, felt confusion was possibly related to medication side effect as UDS was positive for benzos.  Was on Keppra 500 mg twice daily for abnormal EEG, he stopped this since unclear if had seizure, as well as 1st event.   Here with Gavin Pound, niece (tells me is her cousin). Is living alone, niece is 20 minutes ago. She is driving, but hasn't in several weeks. Does her ADLs, Gavin Pound does her medications. Last year, once discharged home from rehab "switch flipped" back to normal, independent. Her sister passed away in April 16, 2022 (they lived together their whole life). In July PCP changed her BP med from losartan to valsartan, few days later, a lot of confusion, wasn't eating, after few weeks that cleared. In September, calling her niece at night,  looking for her sister. The last 2 weeks more confusion, tried to get in her car, drive to another house, sees people in the house who aren't there. Today she had hard time with MOCA, MMSE 24/30. Her niece has cameras in the house she monitors.   Presented to [facility] 11/25/2022 for increasing confusion for the past 4 days.  She was calling family members reporting people being in her home, asking about family that has passed away, not knowing where she is.  Per ER note in August 2024 switch from losartan to valsartan with increased confusion since.  CT head showed generalized volume loss age-appropriate.No etiology determined, discharged home.  UDS positive for benzos, UA negative, sodium 133, potassium 4.8, creatinine 1.52, ethanol negative, TSH 1.836. she is prescribed is Diazepam for anxiety, every night, around 5 PM.   Also in the ER 10/27/2022 for confusion for 3 days prior.  Getting confused at night, walking in her front yard at night.  Her sister passed away a few months ago, has been looking for her.  HISTORY  08/13/21 Dr. Teresa Coombs: This is a 82 year old woman past medical history of hypertension, hyperlipidemia, CAD, GERD who is presenting after being admitted to the hospital for acute encephalopathy.  During her admission, she was also found to have electrolyte imbalance.  Work-up including MRI was negative for this stroke and her EEG showed left focal slowing.  Because of the confusion and abnormal EEG she was started on Keppra 500 mg twice daily.   Prior to hospitalization, her niece Stanton Kidney reports patient was independent, she  lives alone and took care of her sister. She was driving, paying her own bills, doing groceries.  During this period, she will have a period of confusion when with poor sleep but nothing that required them to take her to the hospital.  She has a diagnosis of vertigo, was put on meclizine and Valium when she is symptomatic.  Prior to hospitalization she reported taking the  Valium due to the vertigo and having also poor sleep.  During the week prior to hospitalization, she was also diagnosed with pneumonia and was on antibiotics. Since discharge from the hospital she has been in a subacute rehab.  Initially she was put in a memory unit and she did well to the point that she was transferred to a regular floor but continues to have episode of confusion. She continued to improve but still have some confusion.  She is wanting to go home and also family wants her to come home.  They are trying to set her home with home health services.   Patient reports being compliant with Keppra but noted that she has some nausea with the medication.  Denies any other side effect.       Hospital course  1. Acute encephalopathy, Hospital Delirium Patient presented to the ED with confusion and expressive aphasia likely secondary to electrolyte abnormalities. Patient had negative CT head and MRI. CTA head and neck showed persistent carotid artery stenosis. EEG showed focal slowing with no current seizure activity. Patient was afebrile with no leukocytosis, unremarkable UA, normal lactate. 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 Patient presented hypokalemic at 2.7, hypocalcemic with ionized calcium 0.8, hypomagnesemic to 1.4. Other possible cause of her encephalopathy is polypharmacy as she take diazepam and meclizine at home. Neurology evaluated the patient and thought that she may have had a previous seizure given slowing on EEG. They loaded the patient with Keppra, and she has remained on Keppra 500 mg BID throughout admission. They recommend discharging her on this, and they will see her has an outpatient to determine whether Keppra is indicated long term. The patient's electrolytes were repleted, though she did not show signs of significant improvement. Her magnesium did drop again during admission likely secondary to decreased PO intake. This normalized  after repletion. Suspect that her continued confusion and disorientation is secondary to hospital delirium. Given her confusion and disorientation, PT/OT recommended SNF rehab prior to going home. Patient initially refused this, tearfully stating that she wanted to go home with her sister. After speaking with her sister and her niece, they stated that they would not feel comfortable caring for the patient at home. Care team here found her bed placement at [facility] in [place] near her family. Patient became agreeable to this after talking to her sister and niece and learning that they would be able to visit her there  REVIEW OF SYSTEMS: Out of a complete 14 system review of symptoms, the patient complains only of the following symptoms, and all other reviewed systems are negative.  See HPI  ALLERGIES: Allergies  Allergen Reactions   Allopurinol Hives   Augmentin [Amoxicillin-Pot Clavulanate] Nausea And Vomiting   Doxycycline Hives   Levaquin [Levofloxacin] Other (See Comments)    insomnia   Zithromax [Azithromycin] Diarrhea    HOME MEDICATIONS: Outpatient Medications Prior to Visit  Medication Sig Dispense Refill   amLODipine (NORVASC) 10 MG tablet Take 1 tablet (10 mg total) by mouth daily.     aspirin EC 81  MG tablet Take 1 tablet by mouth daily.     Cholecalciferol 50 MCG (2000 UT) CAPS Take 1 capsule by mouth daily.     Coenzyme Q10 200 MG capsule Take 200 mg by mouth daily.     Ipratropium-Albuterol (COMBIVENT RESPIMAT) 20-100 MCG/ACT AERS respimat Inhale 1 puff into the lungs every 6 (six) hours.     levothyroxine (SYNTHROID) 100 MCG tablet Take 100 mcg by mouth every morning.     melatonin 3 MG TABS tablet Take 1 tablet (3 mg total) by mouth at bedtime.  0   metoprolol succinate (TOPROL-XL) 50 MG 24 hr tablet Take 1 tablet (50 mg total) by mouth daily. Take with or immediately following a meal.     montelukast (SINGULAIR) 10 MG tablet Take 1 tablet by mouth daily.     omeprazole  (PRILOSEC) 40 MG capsule Take 40 mg by mouth 2 (two) times daily.     levETIRAcetam (KEPPRA) 500 MG tablet Take 1 tablet (500 mg total) by mouth 2 (two) times daily.     No facility-administered medications prior to visit.    PAST MEDICAL HISTORY: Past Medical History:  Diagnosis Date   Asthma    Carotid artery stenosis    GERD (gastroesophageal reflux disease)    Gout    Hyperlipidemia    Hypertension    Hypothyroid    IFG (impaired fasting glucose)    Renal disorder     PAST SURGICAL HISTORY: History reviewed. No pertinent surgical history.  FAMILY HISTORY: Family History  Problem Relation Age of Onset   Emphysema Mother    Stroke Father     SOCIAL HISTORY: Social History   Socioeconomic History   Marital status: Single    Spouse name: Not on file   Number of children: 0   Years of education: Not on file   Highest education level: 12th grade  Occupational History   Not on file  Tobacco Use   Smoking status: Never   Smokeless tobacco: Never  Vaping Use   Vaping status: Never Used  Substance and Sexual Activity   Alcohol use: Never   Drug use: Never   Sexual activity: Not on file  Other Topics Concern   Not on file  Social History Narrative   Not on file   Social Determinants of Health   Financial Resource Strain: Not on file  Food Insecurity: Not on file  Transportation Needs: Not on file  Physical Activity: Not on file  Stress: Not on file  Social Connections: Not on file  Intimate Partner Violence: Not on file    PHYSICAL EXAM  Vitals:   12/14/22 0757 12/14/22 0807  BP: (!) 165/65 (!) 140/60  Pulse: 65   Weight: 138 lb (62.6 kg)   Height: 5\' 4"  (1.626 m)    Body mass index is 23.69 kg/m.    12/14/2022    7:58 AM  MMSE - Mini Mental State Exam  Orientation to time 4  Orientation to Place 5  Registration 3  Attention/ Calculation 2  Recall 2  Language- name 2 objects 2  Language- repeat 1  Language- follow 3 step command 2   Language- read & follow direction 1  Write a sentence 1  Copy design 1  Total score 24    Generalized: Well developed, in no acute distress  Neurological examination  Mentation: Alert, oriented, most history is provided by her niece. Patient seems somewhat reserved.  Follows all commands, speech is a little slow, but  is clear Cranial nerve II-XII: Pupils were equal round reactive to light. Extraocular movements were full, visual field were full on confrontational test. Facial sensation and strength were normal.  Head turning and shoulder shrug  were normal and symmetric.  Side-to-side head tremor noted mildly. Motor: The motor testing reveals 5 over 5 strength of all 4 extremities. Good symmetric motor tone is noted throughout.  Sensory: Sensory testing is intact to soft touch on all 4 extremities. No evidence of extinction is noted.  Coordination: Cerebellar testing reveals good finger-nose-finger and heel-to-shin bilaterally.  Gait and station: Gait is wide-based, cautious, independent Reflexes: Deep tendon reflexes are symmetric and normal bilaterally.   DIAGNOSTIC DATA (LABS, IMAGING, TESTING) - I reviewed patient records, labs, notes, testing and imaging myself where available.  Lab Results  Component Value Date   WBC 5.8 07/07/2021   HGB 10.0 (L) 07/07/2021   HCT 28.9 (L) 07/07/2021   MCV 85.8 07/07/2021   PLT 166 07/07/2021      Component Value Date/Time   NA 134 (L) 07/08/2021 0759   K 4.5 07/08/2021 0759   CL 98 07/08/2021 0759   CO2 27 07/08/2021 0759   GLUCOSE 92 07/08/2021 0759   BUN 21 07/08/2021 0759   CREATININE 1.23 (H) 07/08/2021 0759   CALCIUM 9.4 07/08/2021 0759   PROT 6.7 07/02/2021 0808   ALBUMIN 3.8 07/02/2021 0808   AST 15 07/02/2021 0808   ALT 13 07/02/2021 0808   ALKPHOS 82 07/02/2021 0808   BILITOT 1.2 07/02/2021 0808   GFRNONAA 44 (L) 07/08/2021 0759   No results found for: "CHOL", "HDL", "LDLCALC", "LDLDIRECT", "TRIG", "CHOLHDL" Lab Results   Component Value Date   HGBA1C 4.5 (L) 07/02/2021   No results found for: "VITAMINB12" Lab Results  Component Value Date   TSH 1.184 07/03/2021    Margie Ege, AGNP-C, DNP 12/14/2022, 8:27 AM [facility] [address] [place], [place] [address] [phone]

## 2022-12-14 ENCOUNTER — Ambulatory Visit: Payer: Medicare Other | Admitting: Neurology

## 2022-12-14 ENCOUNTER — Encounter: Payer: Self-pay | Admitting: Neurology

## 2022-12-14 VITALS — BP 140/60 | HR 65 | Ht 64.0 in | Wt 138.0 lb

## 2022-12-14 DIAGNOSIS — R410 Disorientation, unspecified: Secondary | ICD-10-CM | POA: Insufficient documentation

## 2022-12-14 DIAGNOSIS — F03B18 Unspecified dementia, moderate, with other behavioral disturbance: Secondary | ICD-10-CM

## 2022-12-14 DIAGNOSIS — F0390 Unspecified dementia without behavioral disturbance: Secondary | ICD-10-CM | POA: Insufficient documentation

## 2022-12-14 MED ORDER — DIVALPROEX SODIUM ER 250 MG PO TB24: 250.0000 mg | ORAL_TABLET | Freq: Every day | ORAL | 6 refills | Status: AC

## 2022-12-14 NOTE — Patient Instructions (Addendum)
Start Depakote ER 250 mg at bedtime for episodes on confusion, hallucinations Check EEG  Check labs  Recommend against driving, would recommend increased supervision Follow up in 4 months with Dr. Teresa Coombs  Orders Placed This Encounter  Procedures   ATN PROFILE   Vitamin B12   EEG adult

## 2022-12-17 LAB — ATN PROFILE
A -- Beta-amyloid 42/40 Ratio: 0.098 — ABNORMAL LOW (ref >0.102)
Beta-amyloid 40: 159.52 pg/mL
Beta-amyloid 42: 15.67 pg/mL
N -- NfL, Plasma: 5.16 pg/mL (ref 0.00–11.55)
T -- p-tau181: 2.49 pg/mL — ABNORMAL HIGH (ref 0.00–0.97)

## 2022-12-17 LAB — VITAMIN B12: Vitamin B-12: >2000 pg/mL — ABNORMAL HIGH (ref 232–1245)

## 2022-12-20 ENCOUNTER — Telehealth: Payer: Self-pay | Admitting: Neurology

## 2022-12-20 NOTE — Telephone Encounter (Signed)
Call to patient to explain final result have not been released by provider, she verbalized understanding and asked that we call her niece Stanton Kidney as well. Call to [facility] and advised we will call her when final results have been release. She asked that we call her with results so she can giver her aunt the results.

## 2022-12-20 NOTE — Telephone Encounter (Signed)
Pt's niece Orest Dikes on [place] called and LVM wanting to speak to the RN or MD regarding the results they saw come up on Mychart. Please advise.

## 2022-12-21 NOTE — Telephone Encounter (Signed)
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  We discussed the need for increased supervision and safety.  She should no longer drive due to past issues getting lost.  She has been doing well with Depakote, 1 spell of confusion when she forgot to take her medication.  B12 level was elevated, but she is on supplement.  I will forward results over to PCP.

## 2022-12-21 NOTE — Telephone Encounter (Signed)
Fexed to

## 2023-01-17 ENCOUNTER — Other Ambulatory Visit: Payer: Medicare Other | Admitting: *Deleted

## 2023-01-17 ENCOUNTER — Encounter: Payer: Self-pay | Admitting: *Deleted

## 2023-07-18 ENCOUNTER — Encounter: Payer: Self-pay | Admitting: Neurology

## 2023-07-18 ENCOUNTER — Ambulatory Visit: Payer: Medicare Other | Admitting: Neurology

## 2024-02-05 IMAGING — MR MR HEAD W/O CM
6 of 10 series · 29 of 48 positions shown · non-contrast
Comparison: None Available.

CLINICAL DATA: Neuro deficit, acute, stroke suspected; aphasia

EXAM:
MRI HEAD WITHOUT CONTRAST
TECHNIQUE: Multiplanar, multiecho pulse sequences of the brain and surrounding
structures were obtained without intravenous contrast.

[Series 2: DWI · axial · 3.0mm · 0.94mm/px · z∈[-86,+56]mm · 9 of 100 slices shown (1 of 2)]
[im 1/100]
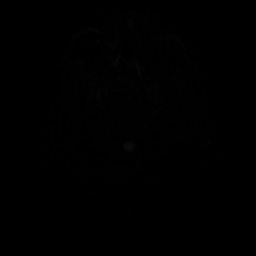
[im 13/100]
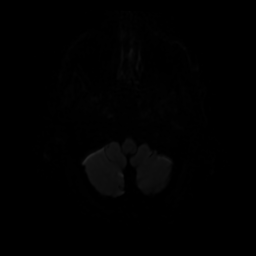
[im 25/100]
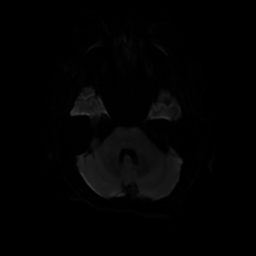
[im 38/100]
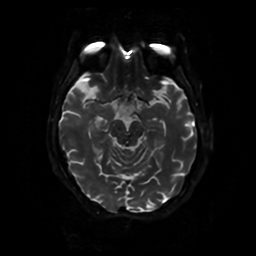
[im 50/100]
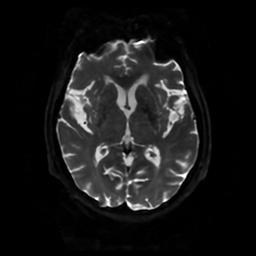
[im 62/100]
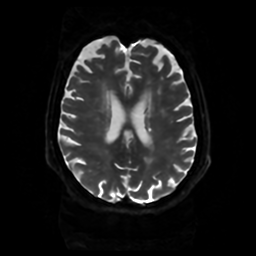
[im 75/100]
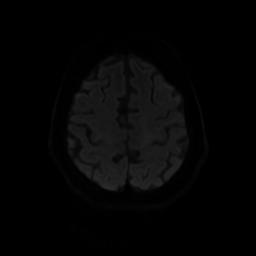
[im 87/100]
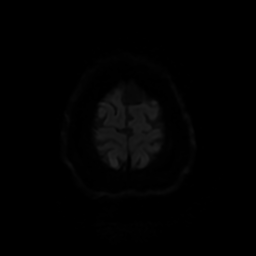
[im 100/100]
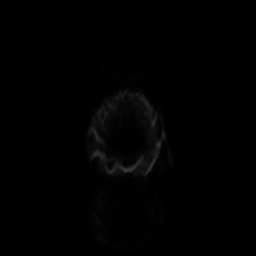

[Series 3: DWI · coronal · 4.0mm · 0.94mm/px · 7 of 72 slices shown (2 of 2)]
[im 1/72]
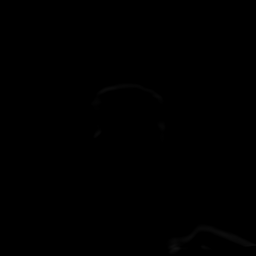
[im 12/72]
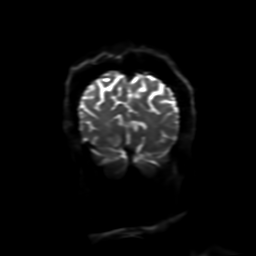
[im 24/72]
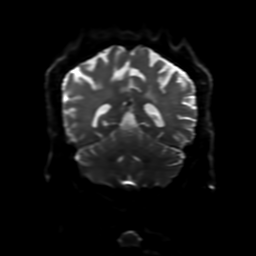
[im 36/72]
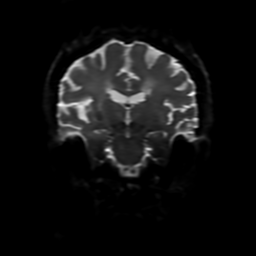
[im 48/72]
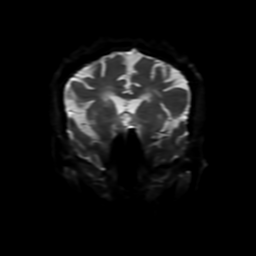
[im 60/72]
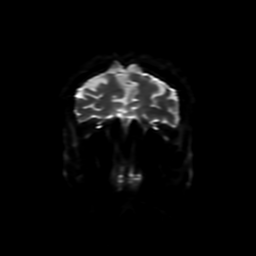
[im 72/72]
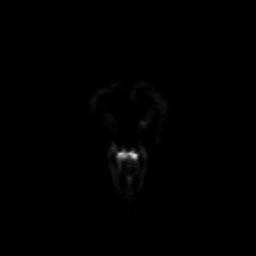

[Series 4: FLAIR · sagittal · 5.0mm · 0.23mm/px · 2 of 23 slices shown (1 of 2)]
[im 1/23]
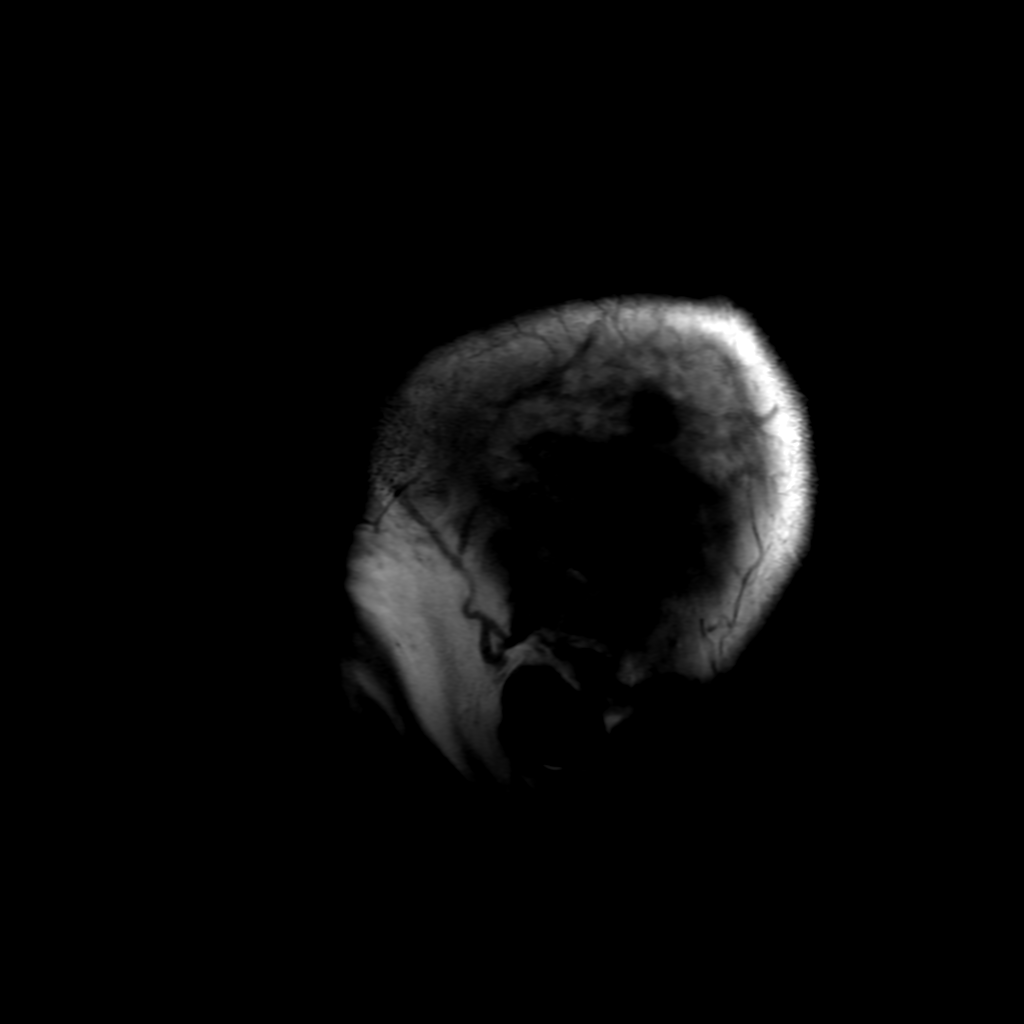
[im 23/23]
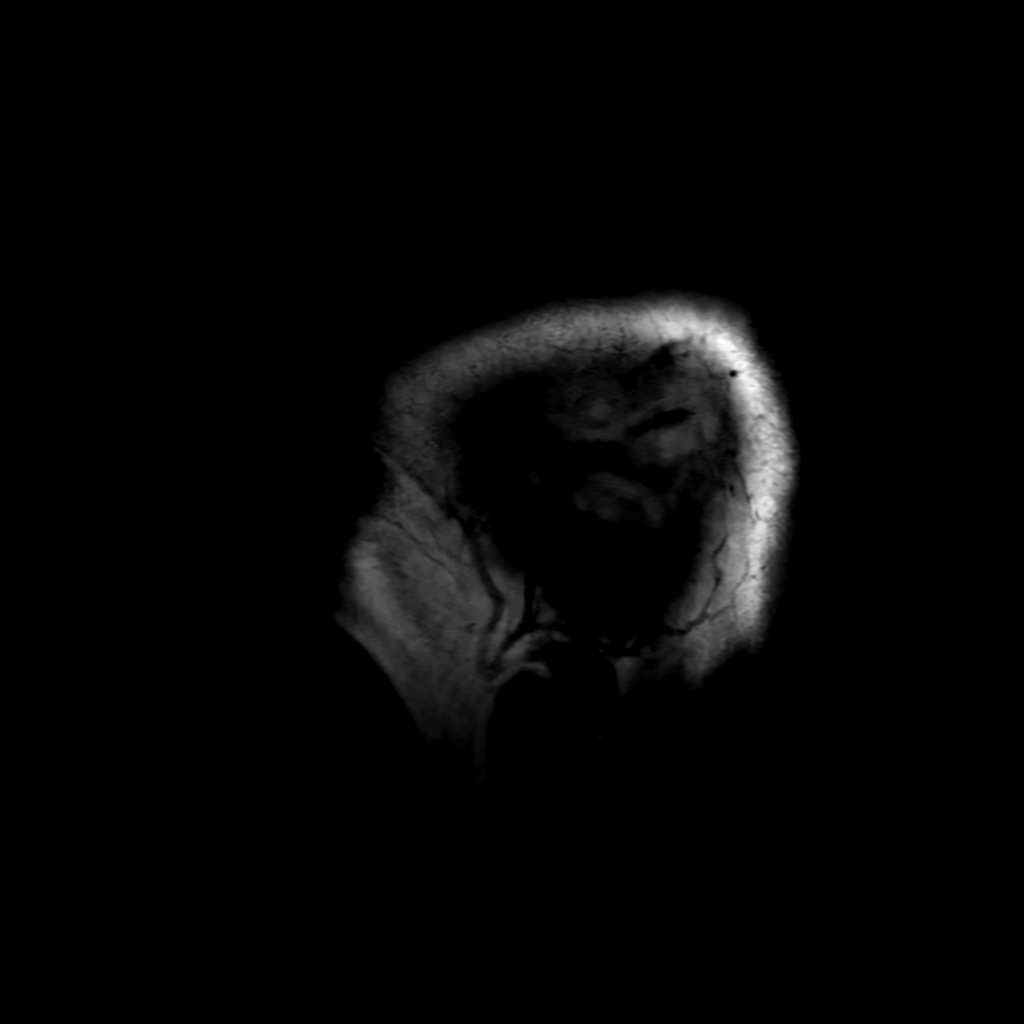

[Series 6: FLAIR · axial · 4.0mm · 0.45mm/px · z∈[-87,+54]mm · 3 of 34 slices shown (2 of 2)]
[im 1/34]
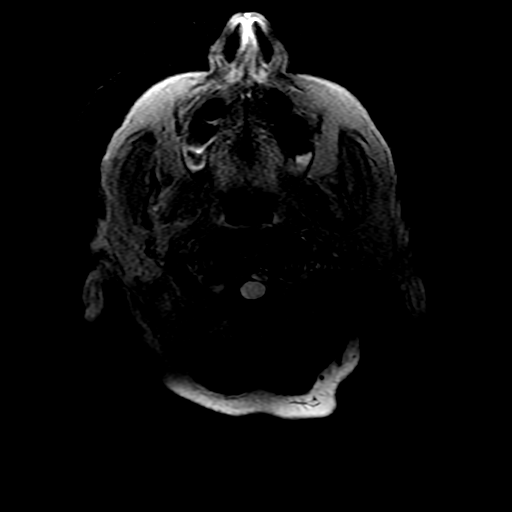
[im 17/34]
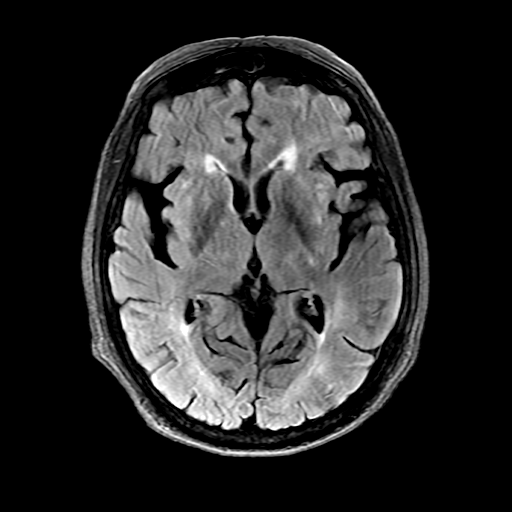
[im 34/34]
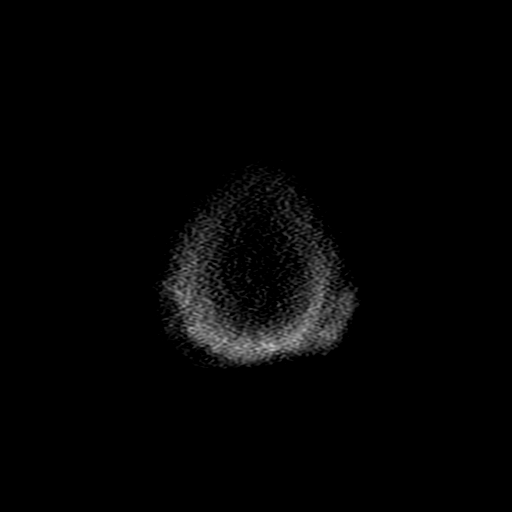

[Series 250: ADC · axial · 3.0mm · 0.94mm/px · z∈[-86,+56]mm · 5 of 50 slices shown (1 of 2)]
[im 1/50]
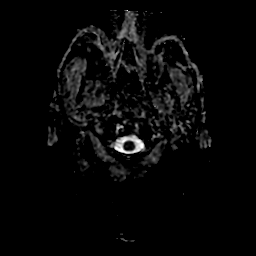
[im 13/50]
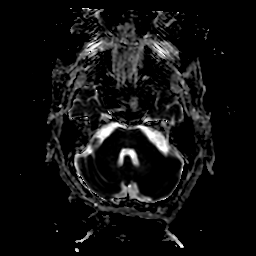
[im 25/50]
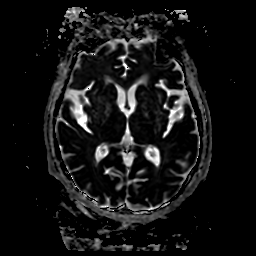
[im 37/50]
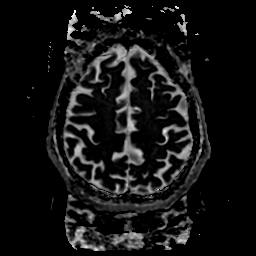
[im 50/50]
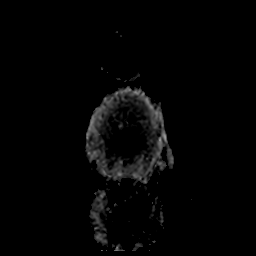

[Series 350: ADC · coronal · 4.0mm · 0.94mm/px · 3 of 35 slices shown (2 of 2)]
[im 1/35]
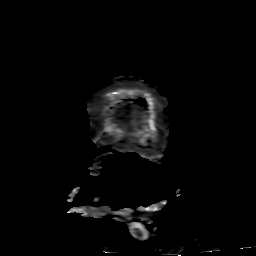
[im 18/35]
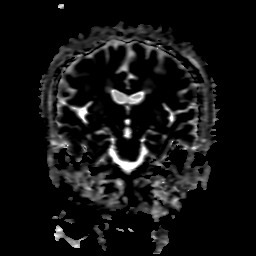
[im 35/35]
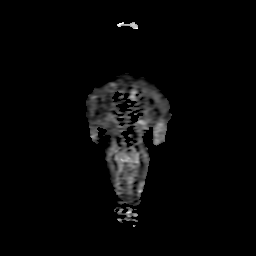

[29 of 48 positions shown; findings below may reference images not displayed]

FINDINGS: Motion artifact is present.

Brain: There is no acute infarction or intracranial hemorrhage.
There is no intracranial mass, mass effect, or edema. There is no
hydrocephalus or extra-axial fluid collection. Prominence of the
ventricles and sulci reflects parenchymal volume loss. Patchy and
confluent areas of T2 hyperintensity in the supratentorial and
pontine white matter are nonspecific but probably reflect moderate
chronic microvascular ischemic changes.

Vascular: Major vessel flow voids at the skull base are preserved.

Skull and upper cervical spine: Normal marrow signal is preserved.

Sinuses/Orbits: Minor mucosal thickening.  Orbits are unremarkable.

Other: Sella is unremarkable.  Mastoid air cells are clear.
IMPRESSION: No acute infarction, hemorrhage, or mass. Moderate chronic
microvascular ischemic changes.
